# Patient Record
Sex: Male | Born: 1955 | Race: Black or African American | Hispanic: No | Smoking: Never smoker
Health system: Southern US, Community
[De-identification: ages and names within clinical notes are randomized; demographics above are authoritative.]

## PROBLEM LIST (undated history)

## (undated) DIAGNOSIS — E559 Vitamin D deficiency, unspecified: Secondary | ICD-10-CM

## (undated) DIAGNOSIS — H2510 Age-related nuclear cataract, unspecified eye: Secondary | ICD-10-CM

## (undated) DIAGNOSIS — E119 Type 2 diabetes mellitus without complications: Secondary | ICD-10-CM

## (undated) DIAGNOSIS — D509 Iron deficiency anemia, unspecified: Secondary | ICD-10-CM

## (undated) DIAGNOSIS — M2041 Other hammer toe(s) (acquired), right foot: Secondary | ICD-10-CM

## (undated) DIAGNOSIS — I10 Essential (primary) hypertension: Secondary | ICD-10-CM

## (undated) DIAGNOSIS — D631 Anemia in chronic kidney disease: Secondary | ICD-10-CM

## (undated) DIAGNOSIS — N189 Chronic kidney disease, unspecified: Secondary | ICD-10-CM

## (undated) DIAGNOSIS — T148XXA Other injury of unspecified body region, initial encounter: Secondary | ICD-10-CM

## (undated) DIAGNOSIS — I214 Non-ST elevation (NSTEMI) myocardial infarction: Secondary | ICD-10-CM

## (undated) DIAGNOSIS — I428 Other cardiomyopathies: Secondary | ICD-10-CM

## (undated) DIAGNOSIS — H3581 Retinal edema: Secondary | ICD-10-CM

## (undated) DIAGNOSIS — E782 Mixed hyperlipidemia: Secondary | ICD-10-CM

## (undated) DIAGNOSIS — Z800 Family history of malignant neoplasm of digestive organs: Secondary | ICD-10-CM

## (undated) DIAGNOSIS — N2581 Secondary hyperparathyroidism of renal origin: Secondary | ICD-10-CM

## (undated) DIAGNOSIS — I82409 Acute embolism and thrombosis of unspecified deep veins of unspecified lower extremity: Secondary | ICD-10-CM

## (undated) DIAGNOSIS — I509 Heart failure, unspecified: Secondary | ICD-10-CM

## (undated) DIAGNOSIS — H409 Unspecified glaucoma: Secondary | ICD-10-CM

## (undated) DIAGNOSIS — I739 Peripheral vascular disease, unspecified: Secondary | ICD-10-CM

## (undated) DIAGNOSIS — N186 End stage renal disease: Secondary | ICD-10-CM

## (undated) DIAGNOSIS — C189 Malignant neoplasm of colon, unspecified: Secondary | ICD-10-CM

## (undated) HISTORY — DX: Other cardiomyopathies: I42.8

## (undated) HISTORY — DX: Unspecified glaucoma: H40.9

## (undated) HISTORY — DX: Other hammer toe(s) (acquired), right foot: M20.41

## (undated) HISTORY — DX: Peripheral vascular disease, unspecified: I73.9

## (undated) HISTORY — DX: Mixed hyperlipidemia: E78.2

## (undated) HISTORY — DX: Heart failure, unspecified: I50.9

## (undated) HISTORY — DX: Chronic kidney disease, unspecified: N18.9

## (undated) HISTORY — DX: Anemia in chronic kidney disease: D63.1

## (undated) HISTORY — DX: Secondary hyperparathyroidism of renal origin: N25.81

## (undated) HISTORY — PX: OTHER SURGICAL HISTORY: SHX169

## (undated) HISTORY — DX: Acute embolism and thrombosis of unspecified deep veins of unspecified lower extremity: I82.409

## (undated) HISTORY — DX: Vitamin D deficiency, unspecified: E55.9

## (undated) HISTORY — DX: Family history of malignant neoplasm of digestive organs: Z80.0

## (undated) HISTORY — DX: Iron deficiency anemia, unspecified: D50.9

## (undated) HISTORY — DX: Age-related nuclear cataract, unspecified eye: H25.10

## (undated) HISTORY — DX: Non-ST elevation (NSTEMI) myocardial infarction: I21.4

## (undated) HISTORY — DX: End stage renal disease: N18.6

## (undated) HISTORY — PX: COLONOSCOPY: SHX174

## (undated) HISTORY — DX: Malignant neoplasm of colon, unspecified: C18.9

## (undated) HISTORY — DX: Retinal edema: H35.81

---

## 2009-07-11 ENCOUNTER — Emergency Department: Payer: Self-pay | Admitting: Internal Medicine

## 2010-06-22 DIAGNOSIS — H3581 Retinal edema: Secondary | ICD-10-CM | POA: Insufficient documentation

## 2010-06-22 DIAGNOSIS — H2510 Age-related nuclear cataract, unspecified eye: Secondary | ICD-10-CM | POA: Insufficient documentation

## 2010-10-01 DIAGNOSIS — I10 Essential (primary) hypertension: Secondary | ICD-10-CM | POA: Insufficient documentation

## 2010-12-20 ENCOUNTER — Emergency Department: Payer: Self-pay | Admitting: Emergency Medicine

## 2011-05-08 ENCOUNTER — Emergency Department: Payer: Self-pay | Admitting: *Deleted

## 2012-11-03 ENCOUNTER — Emergency Department: Payer: Self-pay | Admitting: Internal Medicine

## 2014-02-20 DIAGNOSIS — E782 Mixed hyperlipidemia: Secondary | ICD-10-CM | POA: Insufficient documentation

## 2014-02-20 DIAGNOSIS — E1139 Type 2 diabetes mellitus with other diabetic ophthalmic complication: Secondary | ICD-10-CM | POA: Insufficient documentation

## 2014-02-20 DIAGNOSIS — R0601 Orthopnea: Secondary | ICD-10-CM | POA: Insufficient documentation

## 2014-02-20 DIAGNOSIS — E1140 Type 2 diabetes mellitus with diabetic neuropathy, unspecified: Secondary | ICD-10-CM | POA: Insufficient documentation

## 2014-09-06 ENCOUNTER — Encounter: Payer: Self-pay | Admitting: Emergency Medicine

## 2014-09-06 ENCOUNTER — Emergency Department
Admission: EM | Admit: 2014-09-06 | Discharge: 2014-09-07 | Disposition: A | Discharge status: Other Acute Inpatient Hospital | Payer: Medicaid Other | Attending: Emergency Medicine | Admitting: Emergency Medicine

## 2014-09-06 DIAGNOSIS — I10 Essential (primary) hypertension: Secondary | ICD-10-CM | POA: Insufficient documentation

## 2014-09-06 DIAGNOSIS — F32A Depression, unspecified: Secondary | ICD-10-CM

## 2014-09-06 DIAGNOSIS — F1410 Cocaine abuse, uncomplicated: Secondary | ICD-10-CM | POA: Diagnosis not present

## 2014-09-06 DIAGNOSIS — F1910 Other psychoactive substance abuse, uncomplicated: Secondary | ICD-10-CM

## 2014-09-06 DIAGNOSIS — F329 Major depressive disorder, single episode, unspecified: Secondary | ICD-10-CM | POA: Diagnosis not present

## 2014-09-06 DIAGNOSIS — E119 Type 2 diabetes mellitus without complications: Secondary | ICD-10-CM | POA: Insufficient documentation

## 2014-09-06 HISTORY — DX: Type 2 diabetes mellitus without complications: E11.9

## 2014-09-06 HISTORY — DX: Other injury of unspecified body region, initial encounter: T14.8XXA

## 2014-09-06 HISTORY — DX: Essential (primary) hypertension: I10

## 2014-09-06 LAB — COMPREHENSIVE METABOLIC PANEL
ALT: 11 U/L — ABNORMAL LOW (ref 17–63)
AST: 24 U/L (ref 15–41)
Albumin: 3.1 g/dL — ABNORMAL LOW (ref 3.5–5.0)
Alkaline Phosphatase: 58 U/L (ref 38–126)
Anion gap: 7 (ref 5–15)
BILIRUBIN TOTAL: 0.5 mg/dL (ref 0.3–1.2)
BUN: 14 mg/dL (ref 6–20)
CO2: 29 mmol/L (ref 22–32)
Calcium: 8.4 mg/dL — ABNORMAL LOW (ref 8.9–10.3)
Chloride: 100 mmol/L — ABNORMAL LOW (ref 101–111)
Creatinine, Ser: 0.98 mg/dL (ref 0.61–1.24)
GLUCOSE: 306 mg/dL — AB (ref 65–99)
Potassium: 3.8 mmol/L (ref 3.5–5.1)
Sodium: 136 mmol/L (ref 135–145)
TOTAL PROTEIN: 6.4 g/dL — AB (ref 6.5–8.1)

## 2014-09-06 LAB — CBC WITH DIFFERENTIAL/PLATELET
Basophils Absolute: 0.1 10*3/uL (ref 0–0.1)
Basophils Relative: 1 %
EOS ABS: 0.1 10*3/uL (ref 0–0.7)
Eosinophils Relative: 2 %
HEMATOCRIT: 34.5 % — AB (ref 40.0–52.0)
Hemoglobin: 11.1 g/dL — ABNORMAL LOW (ref 13.0–18.0)
LYMPHS ABS: 2.3 10*3/uL (ref 1.0–3.6)
Lymphocytes Relative: 35 %
MCH: 28.8 pg (ref 26.0–34.0)
MCHC: 32.1 g/dL (ref 32.0–36.0)
MCV: 89.7 fL (ref 80.0–100.0)
MONO ABS: 0.4 10*3/uL (ref 0.2–1.0)
Monocytes Relative: 6 %
NEUTROS ABS: 3.6 10*3/uL (ref 1.4–6.5)
Neutrophils Relative %: 56 %
Platelets: 280 10*3/uL (ref 150–440)
RBC: 3.84 MIL/uL — AB (ref 4.40–5.90)
RDW: 12.7 % (ref 11.5–14.5)
WBC: 6.5 10*3/uL (ref 3.8–10.6)

## 2014-09-06 LAB — URINE DRUG SCREEN, QUALITATIVE (ARMC ONLY)
AMPHETAMINES, UR SCREEN: NOT DETECTED
Barbiturates, Ur Screen: NOT DETECTED
Benzodiazepine, Ur Scrn: NOT DETECTED
Cannabinoid 50 Ng, Ur ~~LOC~~: NOT DETECTED
Cocaine Metabolite,Ur ~~LOC~~: POSITIVE — AB
MDMA (ECSTASY) UR SCREEN: NOT DETECTED
Methadone Scn, Ur: NOT DETECTED
Opiate, Ur Screen: NOT DETECTED
PHENCYCLIDINE (PCP) UR S: NOT DETECTED
Tricyclic, Ur Screen: NOT DETECTED

## 2014-09-06 NOTE — ED Notes (Signed)
BEHAVIORAL HEALTH ROUNDING Patient sleeping: No. Patient alert and oriented: yes Behavior appropriate: Yes.   Nutrition and fluids offered: Yes  Toileting and hygiene offered: Yes  Sitter present: q15 min observations and security camera monitoring Law enforcement present: Yes [employer]

## 2014-09-06 NOTE — ED Notes (Signed)
BEHAVIORAL HEALTH ROUNDING Patient sleeping: Yes.   Patient alert and oriented: not applicable Behavior appropriate: Yes.    Nutrition and fluids offered: No Toileting and hygiene offered: No Sitter present: q15 minute observations and security camera monitoring Law enforcement present: Yes [employer] 

## 2014-09-06 NOTE — ED Notes (Signed)

## 2014-09-06 NOTE — ED Notes (Signed)
Patient observed lying in bed with eyes closed  Even, unlabored respirations observed   NAD pt appears to be sleeping  I will continue to monitor along with every 15 minute visual observations and ongoing security camera monitoring    

## 2014-09-06 NOTE — ED Notes (Signed)
ENVIRONMENTAL ASSESSMENT Potentially harmful objects out of patient reach: Yes.   Personal belongings secured: Yes.   Patient dressed in hospital provided attire only: Yes.   Plastic bags out of patient reach: Yes.   Patient care equipment removed: Yes.   Equipment and supplies removed: Yes.   Potentially toxic materials out of patient reach: Yes.   Sharps container removed or out of patient reach: Yes.   ED BHU [place] Is the patient under IVC or is there intent for IVC: No. Is the patient medically cleared: Yes.   Is there vacancy in the ED BHU: Yes.   Is the population mix appropriate for patient: Yes. Is the patient awaiting placement in inpatient or outpatient setting: unknown at this time. Has the patient had a psychiatric consult: No.   Survey of unit performed for contraband, proper placement and condition of furniture, tampering with fixtures in bathroom, shower, and each patient room: Yes.  ; Findings: none APPEARANCE/BEHAVIOR Calm, cooperative NEURO ASSESSMENT Orientation: A&O x4 Hallucinations: None noted at this time Speech: Normal Gait: normal RESPIRATORY ASSESSMENT No respiratory distress noted CARDIOVASCULAR ASSESSMENT Skin color appropriate for age and race GASTROINTESTINAL ASSESSMENT no GI distress noted EXTREMITIES Moves all extremities PLAN OF CARE Provide calm/safe environment. Vital signs assessed twice daily. ED BHU Assessment once each 12-hour shift. Collaborate with intake RN daily or as condition indicates. Assure the ED provider has rounded once each shift. Provide and encourage hygiene. Provide redirection as needed. Assess for escalating behavior; address immediately and inform ED provider.  Assess family dynamic and appropriateness for visitation as needed: Yes.   Educate the patient/family about BHU procedures/visitation: Yes.

## 2014-09-06 NOTE — ED Notes (Signed)
BEHAVIORAL HEALTH ROUNDING Patient sleeping: No. Patient alert and oriented: yes Behavior appropriate: Yes.  ; If no, describe:  Nutrition and fluids offered: Yes  Toileting and hygiene offered: Yes  Sitter present: yes Law enforcement present: Yes  

## 2014-09-06 NOTE — ED Notes (Signed)
Pt is asleep in bed at this time and in no distress. Snack provided earlier in the evening. Will continue to monitor.

## 2014-09-06 NOTE — ED Notes (Signed)
Pt is asleep in bed and is no distress. Pt denies SI/HI and AVH. V/S WNL. Will continue to monitor.

## 2014-09-06 NOTE — ED Notes (Signed)
Pt comes into the ED searching for detox treatment.  Patient has a history of cocaine use for the past 3 years.  Patient states "I want help, I saw my best friend die from it".

## 2014-09-06 NOTE — ED Notes (Signed)
Resumed care from [employer].  Pt in room watching tv.  Pt calm and cooperative.  Pt denies SI or HI at this time.  Pt denies hearing voices at this time.  Pt alert.

## 2014-09-06 NOTE — ED Notes (Signed)
Pt eating dinner tray now.  Watching tv.

## 2014-09-06 NOTE — ED Notes (Signed)
Pt just ate lunch tray.  Pt is calm and cooperative.  Watching tv.

## 2014-09-06 NOTE — ED Notes (Signed)
ED BHU [place] Is the patient under IVC or is there intent for IVC: no Is the patient medically cleared: Yes.   Is there vacancy in the ED BHU: Yes.   Is the population mix appropriate for patient: Yes.   Is the patient awaiting placement in inpatient or outpatient setting: No. Has the patient had a psychiatric consult:   No consult ordered Survey of unit performed for contraband, proper placement and condition of furniture, tampering with fixtures in bathroom, shower, and each patient room: Yes.  ; Findings:  APPEARANCE/BEHAVIOR Calm and cooperative NEURO ASSESSMENT Orientation: oriented x3  Denies pain Hallucinations: No.None noted (Hallucinations) Speech: Normal Gait: normal RESPIRATORY ASSESSMENT even unlabored respirations  CARDIOVASCULAR ASSESSMENT Pulses equal  regular rate  Skin warm and dry GASTROINTESTINAL ASSESSMENT no GI complaint EXTREMITIES Full ROM PLAN OF CARE Provide calm/safe environment. Vital signs assessed twice daily. ED BHU Assessment once each 12-hour shift. Collaborate with intake RN daily or as condition indicates. Assure the ED provider has rounded once each shift. Provide and encourage hygiene. Provide redirection as needed. Assess for escalating behavior; address immediately and inform ED provider.  Assess family dynamic and appropriateness for visitation as needed: Yes.  ; If necessary, describe findings:  Educate the patient/family about BHU procedures/visitation: Yes.  ; If necessary, describe findings:

## 2014-09-06 NOTE — BH Assessment (Signed)
Assessment Note  Andrew Chan is an 59 y.o. male. Andrew Chan reports that he is trying to get clean. He states "Its messing up my heart.  I saw my best friend die in front of me from drugs and it scared me".  He denied symptoms of depression or anxiety.  He denied auditory or visual hallucinations.  He denied suicidal or homicidal ideation or intent.  He reports drinking 3 cans of beer daily. He denied the use of drugs to the TTS, but informed other staff members of his use of cocaine.  UDS reported positive results of cocaine.   Axis I: Alcohol Abuse and Substance Abuse Axis II: Deferred Axis III:  Past Medical History  Diagnosis Date  . Hypertension   . Diabetes mellitus without complication   . Nerve damage    Axis IV: other psychosocial or environmental problems Axis V: 51-60 moderate symptoms  Past Medical History:  Past Medical History  Diagnosis Date  . Hypertension   . Diabetes mellitus without complication   . Nerve damage     History reviewed. No pertinent past surgical history.  Family History: No family history on file.  Social History:  reports that he has never smoked. He does not have any smokeless tobacco history on file. He reports that he drinks alcohol. He reports that he uses illicit drugs (Cocaine).  Additional Social History:  Alcohol / Drug Use History of alcohol / drug use?: Yes Negative Consequences of Use:  (None reported) Substance #1 Name of Substance 1: Alcohol 1 - Age of First Use: 21 1 - Amount (size/oz): 36 oz 1 - Frequency: daily 1 - Last Use / Amount: 09/04/2014 Substance #2 Name of Substance 2: Cocaine 2 - Age of First Use: Unknown 2 - Amount (size/oz): unknown 2 - Frequency: unknown 2 - Last Use / Amount: Unknown  CIWA: CIWA-Ar BP: (!) 147/92 mmHg Pulse Rate: 91 COWS: Clinical Opiate Withdrawal Scale (COWS) Resting Pulse Rate: Pulse Rate 101-120 Sweating: No report of chills or flushing Restlessness: Able to sit still Pupil Size:  Pupils possibly larger than normal for room light Bone or Joint Aches: Not present Runny Nose or Tearing: Nasal stuffiness or unusually moist eyes GI Upset: No GI symptoms Tremor: No tremor Yawning: No yawning Anxiety or Irritability: None Gooseflesh Skin: Skin is smooth COWS Total Score: 4  Allergies: No Known Allergies  Home Medications:  (Not in a hospital admission)  OB/GYN Status:  No LMP for male patient.  General Assessment Data Location of Assessment: [facility] TTS Assessment: In system Is this a Tele or Face-to-Face Assessment?: Face-to-Face Is this an Initial Assessment or a Re-assessment for this encounter?: Initial Assessment Marital status: Divorced [place] name: n/a Is patient pregnant?: No Pregnancy Status: No Living Arrangements: Other relatives Can pt return to current living arrangement?: Yes Admission Status: Voluntary Is patient capable of signing voluntary admission?: Yes Referral Source: MD Insurance type: Medicaid  Medical Screening Exam ([place]) Medical Exam completed: Yes  Crisis Care Plan Living Arrangements: Other relatives Name of Psychiatrist: None Name of Therapist: None  Education Status Is patient currently in school?: No Current Grade: n/a Highest grade of school patient has completed: 11th Name of school: [employer] Contact person: n/a  Risk to self with the past 6 months Suicidal Ideation: No Has patient been a risk to self within the past 6 months prior to admission? : No Suicidal Intent: No Has patient had any suicidal intent within the past 6 months prior  to admission? : No Is patient at risk for suicide?: No Suicidal Plan?: No Has patient had any suicidal plan within the past 6 months prior to admission? : No Access to Means: No What has been your use of drugs/alcohol within the last 12 months?: alcohol and cocaine usage Previous Attempts/Gestures: No How many times?: 0 Other Self Harm Risks: substance  abuse Triggers for Past Attempts: None known Intentional Self Injurious Behavior: None Family Suicide History: No Recent stressful life event(s): Financial Problems Persecutory voices/beliefs?: No Depression: No Depression Symptoms:  (none reported) Substance abuse history and/or treatment for substance abuse?: No Suicide prevention information given to non-admitted patients: Not applicable  Risk to Others within the past 6 months Homicidal Ideation: No Does patient have any lifetime risk of violence toward others beyond the six months prior to admission? : Unknown Thoughts of Harm to Others: No Current Homicidal Intent: No Current Homicidal Plan: No Access to Homicidal Means: No Identified Victim: None reported History of harm to others?: No Assessment of Violence: On admission Violent Behavior Description: none reported Does patient have access to weapons?: No Criminal Charges Pending?: No Does patient have a court date: No Is patient on probation?: No  Psychosis Hallucinations: None noted Delusions: None noted  Mental Status Report Appearance/Hygiene: In scrubs, Unremarkable Eye Contact: Fair Motor Activity: Unremarkable Speech: Unremarkable Level of Consciousness: Alert Mood: Euthymic Affect: Appropriate to circumstance Anxiety Level: None Thought Processes: Coherent Judgement: Unimpaired Orientation: Person, Place, Time, Situation Obsessive Compulsive Thoughts/Behaviors: None     ADLScreening [facility] Assessment Services) Patient's cognitive ability adequate to safely complete daily activities?: Yes Patient able to express need for assistance with ADLs?: Yes Independently performs ADLs?: Yes (appropriate for developmental age)  Prior Inpatient Therapy Prior Inpatient Therapy: No Prior Therapy Dates: n/a Prior Therapy Facilty/Provider(s): n/a Reason for Treatment: n/a  Prior Outpatient Therapy Prior Outpatient Therapy: Yes Prior Therapy Dates: n/a Prior  Therapy Facilty/Provider(s): n/a Reason for Treatment: n/a Does patient have an ACCT team?: No Does patient have Intensive In-House Services?  : No Does patient have [facility] services? : No Does patient have P4CC services?: No  ADL Screening (condition at time of admission) Patient's cognitive ability adequate to safely complete daily activities?: Yes Patient able to express need for assistance with ADLs?: Yes Independently performs ADLs?: Yes (appropriate for developmental age)       Abuse/Neglect Assessment (Assessment to be complete while patient is alone) Physical Abuse: Denies Verbal Abuse: Denies Sexual Abuse: Denies Exploitation of patient/patient's resources: Denies Self-Neglect: Denies Values / Beliefs Cultural Requests During Hospitalization: None Spiritual Requests During Hospitalization: None   Advance Directives (For Healthcare) Does patient have an advance directive?: No Would patient like information on creating an advanced directive?: No - patient declined information    Additional Information 1:1 In Past 12 Months?: No CIRT Risk: No Elopement Risk: No Does patient have medical clearance?: Yes     Disposition:  Disposition Initial Assessment Completed for this Encounter: Yes Disposition of Patient: Referred to ([facility]) Patient referred to: [facility]  On Site Evaluation by:   Reviewed with Physician:    Elmer Bales 09/06/2014 11:54 PM

## 2014-09-06 NOTE — ED Notes (Signed)
Pt transferred into ED BHU room 7   Patient assigned to appropriate care area. Patient oriented to unit/care area: Informed that, for their safety, care areas are designed for safety and monitored by security cameras at all times; Visiting hours and phone times explained to patient. Patient verbalizes understanding, and verbal contract for safety obtained.

## 2014-09-06 NOTE — ED Notes (Signed)
BEHAVIORAL HEALTH ROUNDING Patient sleeping: No. Patient alert and oriented: yes Behavior appropriate: Yes.  ; If no, describe: calm, cooperative Nutrition and fluids offered: Yes  Toileting and hygiene offered: Yes  Sitter present: q 15 min checks Law enforcement present: Yes

## 2014-09-06 NOTE — ED Provider Notes (Signed)
[facility] Emergency Department Provider Note  Time seen: 3:09 PM  I have reviewed the triage vital signs and the nursing notes.   HISTORY  Chief Complaint Drug Problem    HPI Andrew Chan is a 59 y.o. male with a past medical history of diabetes and hypertension presents to the emergency department looking for help in detoxing from crack cocaine. According to the patient he has been using crack cocaine for 3-4 years. Last use was a few days ago. Patient states he needs help to quit using crack cocaine. States a friend of his recently died from using crack cocaine. Patient admits depression, but specifically denies any SI or HI. No medical complaints today.     Past Medical History  Diagnosis Date  . Hypertension   . Diabetes mellitus without complication   . Nerve damage     There are no active problems to display for this patient.   History reviewed. No pertinent past surgical history.  No current outpatient prescriptions on file.  Allergies Review of patient's allergies indicates no known allergies.  No family history on file.  Social History History  Substance Use Topics  . Smoking status: Never Smoker   . Smokeless tobacco: Not on file  . Alcohol Use: Yes    Review of Systems Constitutional: Negative for fever Cardiovascular: Negative for chest pain. Respiratory: Negative for shortness of breath. Gastrointestinal: Negative for abdominal pain Neurological: Negative for headache 10-point ROS otherwise negative.  ____________________________________________   PHYSICAL EXAM:  VITAL SIGNS: ED Triage Vitals  Enc Vitals Group     BP 09/06/14 1322 155/100 mmHg     Pulse Rate 09/06/14 1322 101     Resp 09/06/14 1322 18     Temp 09/06/14 1322 97.8 F (36.6 C)     Temp Source 09/06/14 1322 Oral     SpO2 09/06/14 1322 98 %     Weight 09/06/14 1322 172 lb (78.019 kg)     Height 09/06/14 1322 5\' 8"  (1.727 m)     Head Cir --    Peak Flow --      Pain Score 09/06/14 1323 2     Pain Loc --      Pain Edu? --      Excl. in [place]? --     Constitutional: Alert and oriented. Well appearing and in no distress. Eyes: Normal exam ENT   Head: Normocephalic and atraumatic Cardiovascular: Normal rate, regular rhythm. No murmur Respiratory: Normal respiratory effort without tachypnea nor retractions. Breath sounds are clear and equal bilaterally. No wheezes/rales/rhonchi. Gastrointestinal: Soft and nontender. No distention.  Musculoskeletal: Nontender with normal range of motion in all extremities. Neurologic:  Normal speech and language. No gross focal neurologic deficits Skin:  Skin is warm, dry and intact.  Psychiatric: Mood and affect are normal. Speech and behavior are normal.   ____________________________________________   INITIAL IMPRESSION / ASSESSMENT AND PLAN / ED COURSE  Pertinent labs & imaging results that were available during my care of the patient were reviewed by me and considered in my medical decision making (see chart for details).  Patient here for crack cocaine detox. Last use was 2-3 days ago per patient. No medical complaints. We will check basic labs, and consult the behavioral health team to see what options he might have.  Blood work within normal limits, currently awaiting urine tox results. Awaiting behavioral health detox options. ____________________________________________   FINAL CLINICAL IMPRESSION(S) / ED DIAGNOSES  Crack cocaine abuse Substance abuse  Depression   Harvest Dark, MD 09/06/14 [phone]

## 2014-09-07 MED ORDER — METFORMIN HCL 1000 MG PO TABS: 1000.0000 mg | ORAL_TABLET | Freq: Two times a day (BID) | ORAL | Status: DC

## 2014-09-07 MED ORDER — GLIPIZIDE ER 5 MG PO TB24: 5.0000 mg | ORAL_TABLET | Freq: Every day | ORAL | Status: DC

## 2014-09-07 NOTE — ED Notes (Signed)
BEHAVIORAL HEALTH ROUNDING Patient sleeping: Yes.   Patient alert and oriented: not applicable Behavior appropriate: Yes.    Nutrition and fluids offered: No Toileting and hygiene offered: No Sitter present: q15 minute observations and security camera monitoring Law enforcement present: Yes [employer] 

## 2014-09-07 NOTE — Discharge Instructions (Signed)
Polysubstance Abuse °When people abuse more than one drug or type of drug it is called polysubstance or polydrug abuse. For example, many smokers also drink alcohol. This is one form of polydrug abuse. Polydrug abuse also refers to the use of a drug to counteract an unpleasant effect produced by another drug. It may also be used to help with withdrawal from another drug. People who take stimulants may become agitated. Sometimes this agitation is countered with a tranquilizer. This helps protect against the unpleasant side effects. Polydrug abuse also refers to the use of different drugs at the same time.  °Anytime drug use is interfering with normal living activities, it has become abuse. This includes problems with family and friends. Psychological dependence has developed when your mind tells you that the drug is needed. This is usually followed by physical dependence which has developed when continuing increases of drug are required to get the same feeling or "high". This is known as addiction or chemical dependency. A person's risk is much higher if there is a history of chemical dependency in the family. °SIGNS OF CHEMICAL DEPENDENCY °· You have been told by friends or family that drugs have become a problem. °· You fight when using drugs. °· You are having blackouts (not remembering what you do while using). °· You feel sick from using drugs but continue using. °· You lie about use or amounts of drugs (chemicals) used. °· You need chemicals to get you going. °· You are suffering in work performance or in school because of drug use. °· You get sick from use of drugs but continue to use anyway. °· You need drugs to relate to people or feel comfortable in social situations. °· You use drugs to forget problems. °"Yes" answered to any of the above signs of chemical dependency indicates there are problems. The longer the use of drugs continues, the greater the problems will become. °If there is a family history of  drug or alcohol use, it is best not to experiment with these drugs. Continual use leads to tolerance. After tolerance develops more of the drug is needed to get the same feeling. This is followed by addiction. With addiction, drugs become the most important part of life. It becomes more important to take drugs than participate in the other usual activities of life. This includes relating to friends and family. Addiction is followed by dependency. Dependency is a condition where drugs are now needed not just to get high, but to feel normal. °Addiction cannot be cured but it can be stopped. This often requires outside help and the care of professionals. Treatment centers are listed in the yellow pages under: Cocaine, Narcotics, and Alcoholics Anonymous. Most hospitals and clinics can refer you to a specialized care center. Talk to your caregiver if you need help. °Document Released: 09/12/2004 Document Revised: 04/15/2011 Document Reviewed: 01/21/2005 °Exit[employer]® Patient Information ©2015 [employer]. This information is not intended to replace advice given to you by your health care provider. Make sure you discuss any questions you have with your health care provider. ° °

## 2014-09-07 NOTE — ED Notes (Signed)
Pt is asleep in bed and in no distress. Will continue to monitor.

## 2014-09-07 NOTE — ED Provider Notes (Signed)
I assumed care of the patient 11:00 PM patient was accepted to [facility]. However facility requested that the patient be discharged with prescriptions for his diabetic management. Reviewed the patient's charts reveal and he was on glipizide and metformin which she will be discharged with there.  Gregor Hams, MD 09/07/14 [phone]

## 2014-09-07 NOTE — ED Notes (Signed)
Pt is awake in his room awaiting transportation to [facility]. He is in no distress.

## 2014-12-07 ENCOUNTER — Emergency Department: Payer: Medicaid Other

## 2014-12-07 ENCOUNTER — Inpatient Hospital Stay
Admission: EM | Admit: 2014-12-07 | Discharge: 2014-12-10 | DRG: 247 | Disposition: A | Discharge status: Home / Self Care | Payer: Medicaid Other | Attending: Internal Medicine | Admitting: Internal Medicine

## 2014-12-07 ENCOUNTER — Other Ambulatory Visit: Payer: Self-pay

## 2014-12-07 ENCOUNTER — Observation Stay
Admit: 2014-12-07 | Discharge: 2014-12-07 | Disposition: A | Discharge status: Home / Self Care | Payer: Medicaid Other | Attending: Physician Assistant | Admitting: Physician Assistant

## 2014-12-07 DIAGNOSIS — Y92238 Other place in hospital as the place of occurrence of the external cause: Secondary | ICD-10-CM | POA: Diagnosis not present

## 2014-12-07 DIAGNOSIS — Z79899 Other long term (current) drug therapy: Secondary | ICD-10-CM

## 2014-12-07 DIAGNOSIS — Z8249 Family history of ischemic heart disease and other diseases of the circulatory system: Secondary | ICD-10-CM

## 2014-12-07 DIAGNOSIS — F1490 Cocaine use, unspecified, uncomplicated: Secondary | ICD-10-CM | POA: Diagnosis present

## 2014-12-07 DIAGNOSIS — I2510 Atherosclerotic heart disease of native coronary artery without angina pectoris: Secondary | ICD-10-CM | POA: Diagnosis present

## 2014-12-07 DIAGNOSIS — Y900 Blood alcohol level of less than 20 mg/100 ml: Secondary | ICD-10-CM | POA: Diagnosis present

## 2014-12-07 DIAGNOSIS — R7989 Other specified abnormal findings of blood chemistry: Secondary | ICD-10-CM

## 2014-12-07 DIAGNOSIS — M79605 Pain in left leg: Secondary | ICD-10-CM | POA: Diagnosis present

## 2014-12-07 DIAGNOSIS — I10 Essential (primary) hypertension: Secondary | ICD-10-CM | POA: Diagnosis present

## 2014-12-07 DIAGNOSIS — R079 Chest pain, unspecified: Secondary | ICD-10-CM | POA: Diagnosis present

## 2014-12-07 DIAGNOSIS — E785 Hyperlipidemia, unspecified: Secondary | ICD-10-CM | POA: Diagnosis present

## 2014-12-07 DIAGNOSIS — F329 Major depressive disorder, single episode, unspecified: Secondary | ICD-10-CM | POA: Diagnosis present

## 2014-12-07 DIAGNOSIS — Z833 Family history of diabetes mellitus: Secondary | ICD-10-CM

## 2014-12-07 DIAGNOSIS — Y710 Diagnostic and monitoring cardiovascular devices associated with adverse incidents: Secondary | ICD-10-CM | POA: Diagnosis not present

## 2014-12-07 DIAGNOSIS — W19XXXA Unspecified fall, initial encounter: Secondary | ICD-10-CM

## 2014-12-07 DIAGNOSIS — Y848 Other medical procedures as the cause of abnormal reaction of the patient, or of later complication, without mention of misadventure at the time of the procedure: Secondary | ICD-10-CM | POA: Diagnosis not present

## 2014-12-07 DIAGNOSIS — E1142 Type 2 diabetes mellitus with diabetic polyneuropathy: Secondary | ICD-10-CM | POA: Diagnosis present

## 2014-12-07 DIAGNOSIS — I249 Acute ischemic heart disease, unspecified: Secondary | ICD-10-CM

## 2014-12-07 DIAGNOSIS — Z7984 Long term (current) use of oral hypoglycemic drugs: Secondary | ICD-10-CM

## 2014-12-07 DIAGNOSIS — I214 Non-ST elevation (NSTEMI) myocardial infarction: Principal | ICD-10-CM | POA: Diagnosis present

## 2014-12-07 DIAGNOSIS — F10129 Alcohol abuse with intoxication, unspecified: Secondary | ICD-10-CM | POA: Diagnosis present

## 2014-12-07 DIAGNOSIS — L7602 Intraoperative hemorrhage and hematoma of skin and subcutaneous tissue complicating other procedure: Secondary | ICD-10-CM | POA: Diagnosis not present

## 2014-12-07 DIAGNOSIS — R778 Other specified abnormalities of plasma proteins: Secondary | ICD-10-CM | POA: Diagnosis present

## 2014-12-07 LAB — ETHANOL: Alcohol, Ethyl (B): 173 mg/dL — ABNORMAL HIGH (ref <5)

## 2014-12-07 LAB — BASIC METABOLIC PANEL
Anion gap: 7 (ref 5–15)
BUN: 14 mg/dL (ref 6–20)
CALCIUM: 8.4 mg/dL — AB (ref 8.9–10.3)
CO2: 26 mmol/L (ref 22–32)
Chloride: 106 mmol/L (ref 101–111)
Creatinine, Ser: 0.97 mg/dL (ref 0.61–1.24)
GFR calc non Af Amer: >60 mL/min (ref >60)
Glucose, Bld: 214 mg/dL — ABNORMAL HIGH (ref 65–99)
Potassium: 3.4 mmol/L — ABNORMAL LOW (ref 3.5–5.1)
SODIUM: 139 mmol/L (ref 135–145)

## 2014-12-07 LAB — CBC
HCT: 33.5 % — ABNORMAL LOW (ref 40.0–52.0)
Hemoglobin: 10.9 g/dL — ABNORMAL LOW (ref 13.0–18.0)
MCH: 29.7 pg (ref 26.0–34.0)
MCHC: 32.5 g/dL (ref 32.0–36.0)
MCV: 91.2 fL (ref 80.0–100.0)
Platelets: 338 10*3/uL (ref 150–440)
RBC: 3.67 MIL/uL — AB (ref 4.40–5.90)
RDW: 13.2 % (ref 11.5–14.5)
WBC: 8.3 10*3/uL (ref 3.8–10.6)

## 2014-12-07 LAB — GLUCOSE, CAPILLARY
Glucose-Capillary: 133 mg/dL — ABNORMAL HIGH (ref 65–99)
Glucose-Capillary: 124 mg/dL — ABNORMAL HIGH (ref 65–99)
Glucose-Capillary: 96 mg/dL (ref 65–99)

## 2014-12-07 LAB — TSH: TSH: 2.034 u[IU]/mL (ref 0.350–4.500)

## 2014-12-07 LAB — HEMOGLOBIN A1C: Hgb A1c MFr Bld: 11.6 % — ABNORMAL HIGH (ref 4.0–6.0)

## 2014-12-07 LAB — TROPONIN I
TROPONIN I: 0.15 ng/mL — AB (ref <0.031)
Troponin I: 0.15 ng/mL — ABNORMAL HIGH (ref <0.031)
Troponin I: 0.15 ng/mL — ABNORMAL HIGH (ref <0.031)

## 2014-12-07 LAB — PROTIME-INR
INR: 0.93
PROTHROMBIN TIME: 12.7 s (ref 11.4–15.0)

## 2014-12-07 MED ORDER — ASPIRIN 81 MG PO CHEW
324.0000 mg | CHEWABLE_TABLET | Freq: Once | ORAL | Status: AC
  Administered 2014-12-07: 324 mg via ORAL
  Filled 2014-12-07: qty 4

## 2014-12-07 MED ORDER — LISINOPRIL 20 MG PO TABS
40.0000 mg | ORAL_TABLET | Freq: Every day | ORAL | Status: DC
Start: 2014-12-07 — End: 2014-12-08
  Administered 2014-12-07: 40 mg via ORAL
  Filled 2014-12-07: qty 2

## 2014-12-07 MED ORDER — HYDROCHLOROTHIAZIDE 25 MG PO TABS
25.0000 mg | ORAL_TABLET | Freq: Every day | ORAL | Status: DC
  Administered 2014-12-07: 25 mg via ORAL
  Filled 2014-12-07: qty 1

## 2014-12-07 MED ORDER — ONDANSETRON HCL 4 MG/2ML IJ SOLN: 4.0000 mg | Freq: Four times a day (QID) | INTRAMUSCULAR | Status: DC | PRN

## 2014-12-07 MED ORDER — INSULIN ASPART 100 UNIT/ML ~~LOC~~ SOLN
0.0000 [IU] | Freq: Three times a day (TID) | SUBCUTANEOUS | Status: DC
  Administered 2014-12-07 (×2): 1 [IU] via SUBCUTANEOUS
  Filled 2014-12-07 (×2): qty 1

## 2014-12-07 MED ORDER — NITROGLYCERIN 0.4 MG SL SUBL
0.4000 mg | SUBLINGUAL_TABLET | SUBLINGUAL | Status: DC | PRN
  Administered 2014-12-08: 0.4 mg via SUBLINGUAL

## 2014-12-07 MED ORDER — ONDANSETRON HCL 4 MG PO TABS: 4.0000 mg | ORAL_TABLET | Freq: Four times a day (QID) | ORAL | Status: DC | PRN

## 2014-12-07 MED ORDER — DOCUSATE SODIUM 100 MG PO CAPS
100.0000 mg | ORAL_CAPSULE | Freq: Two times a day (BID) | ORAL | Status: DC
  Administered 2014-12-07 – 2014-12-10 (×6): 100 mg via ORAL
  Filled 2014-12-07 (×7): qty 1

## 2014-12-07 MED ORDER — SODIUM CHLORIDE 0.9 % IJ SOLN
3.0000 mL | Freq: Two times a day (BID) | INTRAMUSCULAR | Status: DC
  Administered 2014-12-09: 3 mL via INTRAVENOUS

## 2014-12-07 MED ORDER — ACETAMINOPHEN 325 MG PO TABS: 650.0000 mg | ORAL_TABLET | Freq: Four times a day (QID) | ORAL | Status: DC | PRN

## 2014-12-07 MED ORDER — INFLUENZA VAC SPLIT QUAD 0.5 ML IM SUSY: 0.5000 mL | PREFILLED_SYRINGE | INTRAMUSCULAR | Status: DC

## 2014-12-07 MED ORDER — SODIUM CHLORIDE 0.9 % IJ SOLN
3.0000 mL | INTRAMUSCULAR | Status: DC | PRN
Start: 2014-12-07 — End: 2014-12-08

## 2014-12-07 MED ORDER — HEPARIN SODIUM (PORCINE) 5000 UNIT/ML IJ SOLN
5000.0000 [IU] | Freq: Three times a day (TID) | INTRAMUSCULAR | Status: DC
  Administered 2014-12-07 – 2014-12-08 (×4): 5000 [IU] via SUBCUTANEOUS
  Filled 2014-12-07 (×4): qty 1

## 2014-12-07 MED ORDER — SODIUM CHLORIDE 0.9 % WEIGHT BASED INFUSION
1.0000 mL/kg/h | INTRAVENOUS | Status: DC
  Administered 2014-12-08: 1 mL/kg/h via INTRAVENOUS

## 2014-12-07 MED ORDER — ACETAMINOPHEN 650 MG RE SUPP: 650.0000 mg | Freq: Four times a day (QID) | RECTAL | Status: DC | PRN

## 2014-12-07 MED ORDER — ATORVASTATIN CALCIUM 20 MG PO TABS
80.0000 mg | ORAL_TABLET | Freq: Every day | ORAL | Status: DC
  Administered 2014-12-07: 80 mg via ORAL
  Filled 2014-12-07: qty 4

## 2014-12-07 MED ORDER — POTASSIUM CHLORIDE IN NACL 20-0.9 MEQ/L-% IV SOLN
INTRAVENOUS | Status: DC
  Administered 2014-12-07 – 2014-12-08 (×3): via INTRAVENOUS
  Filled 2014-12-07 (×7): qty 1000

## 2014-12-07 MED ORDER — SODIUM CHLORIDE 0.9 % IJ SOLN: 3.0000 mL | Freq: Two times a day (BID) | INTRAMUSCULAR | Status: DC

## 2014-12-07 MED ORDER — SODIUM CHLORIDE 0.9 % WEIGHT BASED INFUSION: 3.0000 mL/kg/h | INTRAVENOUS | Status: AC

## 2014-12-07 MED ORDER — INSULIN ASPART 100 UNIT/ML ~~LOC~~ SOLN: 0.0000 [IU] | Freq: Every day | SUBCUTANEOUS | Status: DC

## 2014-12-07 MED ORDER — SODIUM CHLORIDE 0.9 % IV SOLN: 250.0000 mL | INTRAVENOUS | Status: DC | PRN

## 2014-12-07 MED ORDER — CITALOPRAM HYDROBROMIDE 20 MG PO TABS
20.0000 mg | ORAL_TABLET | Freq: Every day | ORAL | Status: DC
  Administered 2014-12-07: 20 mg via ORAL
  Filled 2014-12-07: qty 1

## 2014-12-07 MED ORDER — ASPIRIN EC 81 MG PO TBEC
81.0000 mg | DELAYED_RELEASE_TABLET | Freq: Every day | ORAL | Status: DC
  Administered 2014-12-07: 81 mg via ORAL
  Filled 2014-12-07: qty 1

## 2014-12-07 MED ORDER — ASPIRIN 81 MG PO CHEW
81.0000 mg | CHEWABLE_TABLET | ORAL | Status: AC
  Administered 2014-12-08: 81 mg via ORAL
  Filled 2014-12-07: qty 1

## 2014-12-07 NOTE — ED Notes (Signed)
Dr Diamond at bedside. 

## 2014-12-07 NOTE — ED Provider Notes (Signed)
[facility] Emergency Department Provider Note  ____________________________________________  Time seen: Approximately 4:09 AM  I have reviewed the triage vital signs and the nursing notes.   HISTORY  Chief Complaint Fall    HPI Andrew Chan is a 59 y.o. male who comes into the hospital today after having a fall. The patient reports that he was going to use the bathroom and he fell. The patient thinks his legs might have given out on him. The patient reports that his left foot is a little swollen but he is unsure why that is the case. The patient has a history of diabetes and bilateral lower extremity numbness. The patient reports he fell on his right side and did not hit his head. He reports that he was on the floor for one hour as he could not get up. 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 The patient reports that he has occasional chest pain with the last episode being yesterday he does have some shortness of breath he denies nausea and vomiting. He has occasional abdominal pain as well. The patient was not going to come into the hospital initially but decided to come in and get checked out.The patient again is not in any pain at this time.   Past Medical History  Diagnosis Date  . Hypertension   . Diabetes mellitus without complication ([place])   . Nerve damage     There are no active problems to display for this patient.   History reviewed. No pertinent past surgical history.  Current Outpatient Rx  Name  Route  Sig  Dispense  Refill  . atorvastatin (LIPITOR) 80 MG tablet   Oral   Take 80 mg by mouth daily.         . citalopram (CELEXA) 20 MG tablet   Oral   Take 20 mg by mouth daily.         Marland Kitchen glipiZIDE (GLUCOTROL XL) 5 MG 24 hr tablet   Oral   Take 1 tablet (5 mg total) by mouth daily.   30 tablet   0   . hydrochlorothiazide (HYDRODIURIL) 25 MG tablet   Oral   Take 25 mg by  mouth daily.         Marland Kitchen lisinopril (PRINIVIL,ZESTRIL) 40 MG tablet   Oral   Take 40 mg by mouth daily.         . metFORMIN (GLUCOPHAGE) 1000 MG tablet   Oral   Take 1 tablet (1,000 mg total) by mouth 2 (two) times daily with a meal.   60 tablet   0   . nitroGLYCERIN (NITROSTAT) 0.4 MG SL tablet   Sublingual   Place 0.4 mg under the tongue every 5 (five) minutes as needed for chest pain.           Allergies Review of patient's allergies indicates no known allergies.  No family history on file.  Social History Social History  Substance Use Topics  . Smoking status: Never Smoker   . Smokeless tobacco: None  . Alcohol Use: Yes    Review of Systems Constitutional: No fever/chills Eyes: No visual changes. ENT: No sore throat. Cardiovascular: chest pain. Respiratory: shortness of breath. Gastrointestinal: Intermittent abdominal pain.  No nausea, no vomiting.  No diarrhea.  No constipation. Genitourinary: Negative for dysuria. Musculoskeletal: Negative for back pain. Skin: Negative for rash. Neurological: Numbness in bilateral lower extremities. Negative for headaches, focal weakness  10-point ROS otherwise negative.  ____________________________________________   PHYSICAL EXAM:  VITAL SIGNS: ED Triage Vitals  Enc Vitals Group     BP 12/07/14 0355 134/91 mmHg     Pulse Rate 12/07/14 0355 93     Resp 12/07/14 0355 27     Temp 12/07/14 0355 98.6 F (37 C)     Temp src --      SpO2 12/07/14 0355 94 %     Weight 12/07/14 0355 165 lb (74.844 kg)     Height --      Head Cir --      Peak Flow --      Pain Score 12/07/14 0357 0     Pain Loc --      Pain Edu? --      Excl. in [place]? --     Constitutional: Alert and oriented. Well appearing and in no acute distress. Eyes: Conjunctivae are normal. PERRL. EOMI. Head: Atraumatic. Nose: No congestion/rhinnorhea. Mouth/Throat: Mucous membranes are moist.  Oropharynx non-erythematous. Cardiovascular: Normal rate,  regular rhythm. Grossly normal heart sounds.  Good peripheral circulation. Respiratory: Normal respiratory effort.  No retractions. Lungs CTAB. Gastrointestinal: Soft and nontender. No distention. Positive bowel sounds Musculoskeletal: Mild swelling to left lower extremity Neurologic:  Normal speech and language. No gross focal neurologic deficits are appreciated.  Skin:  Skin is warm, dry and intact.  Psychiatric: Mood and affect are normal.   ____________________________________________   LABS (all labs ordered are listed, but only abnormal results are displayed)  Labs Reviewed  BASIC METABOLIC PANEL - Abnormal; Notable for the following:    Potassium 3.4 (*)    Glucose, Bld 214 (*)    Calcium 8.4 (*)    All other components within normal limits  CBC - Abnormal; Notable for the following:    RBC 3.67 (*)    Hemoglobin 10.9 (*)    HCT 33.5 (*)    All other components within normal limits  ETHANOL - Abnormal; Notable for the following:    Alcohol, Ethyl (B) 173 (*)    All other components within normal limits  TROPONIN I - Abnormal; Notable for the following:    Troponin I 0.15 (*)    All other components within normal limits  URINALYSIS COMPLETEWITH MICROSCOPIC ([facility] ONLY)   ____________________________________________  EKG  ED ECG REPORT I, Loney Hering, the attending physician, personally viewed and interpreted this ECG.   Date: 12/07/2014  EKG Time: 358  Rate: 91  Rhythm: normal sinus rhythm  Axis: Normal  Intervals:none  ST&T Change: None  ____________________________________________  RADIOLOGY  Chest x-ray: No acute findings Lower extremity ultrasound ____________________________________________   PROCEDURES  Procedure(s) performed: None  Critical Care performed: No  ____________________________________________   INITIAL IMPRESSION / ASSESSMENT AND PLAN / ED COURSE  Pertinent labs & imaging results that were available during my care of  the patient were reviewed by me and considered in my medical decision making (see chart for details).  This is a 59 year old male who comes in today with a fall at home. The patient reports that his legs gave out on him. I will check some blood work as well as do a chest x-ray and an ultrasound of the patient's lower extremity and reassess the patient once I received the assaults.  The patient's blood work did show a troponin of 0.15. The patient did have some chest pain yesterday which could be the cause of the elevated troponin. The patient is also intoxicated with an  ethanol of 173. I contacted the hospitalist service to admit the patient for his acute coronary syndrome. At this time the patient is still waiting to receive his left lower extremity ultrasound ____________________________________________   FINAL CLINICAL IMPRESSION(S) / ED DIAGNOSES  Final diagnoses:  Fall, initial encounter  Acute coronary syndrome (HCC)      Loney Hering, MD 12/07/14 [phone]

## 2014-12-07 NOTE — Care Management (Signed)
Patient lives in residence with his sister Ada [phone]).  He says his legs get numb due to his diabetes which he says "I should do better with that."  Hb A1C results pending.  Patient says he is not on injections.  Is followed by the [place] [facility] by Dr Lucia Gaskins.  Does not know her first name.  Was seen at the clinic 2 weeks ago.  his sister provides transportation.  Patient denies problems accessing medical care or obtaining medications but admits to not taking his medication right.  He was positive for etoh and says his sister keeps saying he needs to stop that.  He does not feel he needs any resources.  There was no urine tox for this admission.  Patient denies any illicit drug use.  Confirmed address and correct his phone number from [phone] to [phone].

## 2014-12-07 NOTE — ED Notes (Addendum)
Patient transported to Ultrasound via stretcher 

## 2014-12-07 NOTE — ED Notes (Signed)
Pt attempting to provide urine specimen at this time.  

## 2014-12-07 NOTE — ED Notes (Signed)
Pt fell into the bathtub tonight while going to the bathroom. Per EMS pt does not know how long he was laying there. Family found pt. Per EMS pt has been drinking tonight. Pt states that he has not felt sensation in his lower legs for years, but has not seen a doctor about it. Pt has an old skin tear on his lower right leg.

## 2014-12-07 NOTE — ED Notes (Addendum)
Pt states that he does not feel anything below the knees, but this is not a new symptom. He has not been able to feel sensation below his knees for a few years now. Pt was able to bend his knees when taking off his pants to change into a hospital gown. Pt states that he normally ambulates at home despite sensation loss. Pt states "I think my legs just gave out on me."

## 2014-12-07 NOTE — Consult Note (Signed)
Primary Cardiologist: None   Reason for Consultation : Elevated troponin   HPI: This is a 59yoM with PMHx HTN, HL and DM who presented to [facility] via EMS after a fall. He denies CP, pressure, tightness, SOB, orthopnea or PND. He was found to have mildly elevated troponin, thus cardiology was consulted. Per patient, he does not have a history or CAD or MI.         Review of Systems: General: negative for chills, fever, night sweats or weight changes.  Cardiovascular: negative for chest pain, edema, orthopnea, palpitations, paroxysmal nocturnal dyspnea, shortness of breath or dyspnea on exertion Dermatological: negative for rash Respiratory: negative for cough or wheezing Urologic: negative for hematuria Abdominal: negative for nausea, vomiting, diarrhea, bright red blood per rectum, melena, or hematemesis Neurologic: negative for visual changes, syncope, or dizziness All other systems reviewed and are otherwise negative except as noted above.    Past Medical History  Diagnosis Date  . Hypertension   . Diabetes mellitus without complication ([place])   . Nerve damage     Medications Prior to Admission  Medication Sig Dispense Refill  . atorvastatin (LIPITOR) 80 MG tablet Take 80 mg by mouth daily.    . citalopram (CELEXA) 20 MG tablet Take 20 mg by mouth daily.    Marland Kitchen glipiZIDE (GLUCOTROL XL) 5 MG 24 hr tablet Take 1 tablet (5 mg total) by mouth daily. 30 tablet 0  . hydrochlorothiazide (HYDRODIURIL) 25 MG tablet Take 25 mg by mouth daily.    Marland Kitchen lisinopril (PRINIVIL,ZESTRIL) 40 MG tablet Take 40 mg by mouth daily.    . metFORMIN (GLUCOPHAGE) 1000 MG tablet Take 1 tablet (1,000 mg total) by mouth 2 (two) times daily with a meal. 60 tablet 0  . nitroGLYCERIN (NITROSTAT) 0.4 MG SL tablet Place 0.4 mg under the tongue every 5 (five) minutes as needed for chest pain.       Marland Kitchen aspirin EC  81 mg Oral Daily  . atorvastatin  80 mg Oral Daily  . citalopram  20 mg Oral Daily  . docusate  sodium  100 mg Oral BID  . heparin  5,000 Units Subcutaneous 3 times per day  . hydrochlorothiazide  25 mg Oral Daily  . insulin aspart  0-5 Units Subcutaneous QHS  . insulin aspart  0-9 Units Subcutaneous TID WC  . lisinopril  40 mg Oral Daily  . sodium chloride  3 mL Intravenous Q12H    Infusions: . 0.9 % NaCl with KCl 20 mEq / L 100 mL/hr at 12/07/14 0830    No Known Allergies  Social History   Social History  . Marital Status: Single    Spouse Name: N/A  . Number of Children: N/A  . Years of Education: N/A   Occupational History  . Not on file.   Social History Main Topics  . Smoking status: Never Smoker   . Smokeless tobacco: Not on file  . Alcohol Use: Yes  . Drug Use: Yes    Special: Cocaine  . Sexual Activity: Not on file   Other Topics Concern  . Not on file   Social History Narrative    Family History  Problem Relation Age of Onset  . CAD Brother   . CAD Mother   . Diabetes Mellitus II Sister     PHYSICAL EXAM: Filed Vitals:   12/07/14 0653  BP: 131/85  Pulse: 89  Temp: 98.6 F (37 C)  Resp: 18     Intake/Output Summary (  Last 24 hours) at 12/07/14 1112 Last data filed at 12/07/14 0655  Gross per 24 hour  Intake      0 ml  Output      0 ml  Net      0 ml    General:  Well appearing. No respiratory difficulty HEENT: normal Neck: supple. no JVD. Carotids 2+ bilat; no bruits. No lymphadenopathy or thryomegaly appreciated. Cor: PMI nondisplaced. Regular rate & rhythm. No rubs, gallops or murmurs. Lungs: clear Abdomen: soft, nontender, nondistended. No hepatosplenomegaly. No bruits or masses. Good bowel sounds. Extremities: no cyanosis, clubbing, rash, edema Neuro: alert & oriented x 3, cranial nerves grossly intact. moves all 4 extremities w/o difficulty. Affect pleasant.  ECG: NSR 91 BPM NS ST/T changes   Results for orders placed or performed during the hospital encounter of 12/07/14 (from the past 24 hour(s))  Basic metabolic panel      Status: Abnormal   Collection Time: 12/07/14  4:08 AM  Result Value Ref Range   Sodium 139 135 - 145 mmol/L   Potassium 3.4 (L) 3.5 - 5.1 mmol/L   Chloride 106 101 - 111 mmol/L   CO2 26 22 - 32 mmol/L   Glucose, Bld 214 (H) 65 - 99 mg/dL   BUN 14 6 - 20 mg/dL   Creatinine, Ser 0.97 0.61 - 1.24 mg/dL   Calcium 8.4 (L) 8.9 - 10.3 mg/dL   GFR calc non Af Amer >60 >60 mL/min   GFR calc Af Amer >60 >60 mL/min   Anion gap 7 5 - 15  CBC     Status: Abnormal   Collection Time: 12/07/14  4:08 AM  Result Value Ref Range   WBC 8.3 3.8 - 10.6 K/uL   RBC 3.67 (L) 4.40 - 5.90 MIL/uL   Hemoglobin 10.9 (L) 13.0 - 18.0 g/dL   HCT 33.5 (L) 40.0 - 52.0 %   MCV 91.2 80.0 - 100.0 fL   MCH 29.7 26.0 - 34.0 pg   MCHC 32.5 32.0 - 36.0 g/dL   RDW 13.2 11.5 - 14.5 %   Platelets 338 150 - 440 K/uL  Ethanol     Status: Abnormal   Collection Time: 12/07/14  4:08 AM  Result Value Ref Range   Alcohol, Ethyl (B) 173 (H) <5 mg/dL  Troponin I     Status: Abnormal   Collection Time: 12/07/14  4:08 AM  Result Value Ref Range   Troponin I 0.15 (H) <0.031 ng/mL  TSH     Status: None   Collection Time: 12/07/14  4:08 AM  Result Value Ref Range   TSH 2.034 0.350 - 4.500 uIU/mL   Dg Chest 2 View  12/07/2014  CLINICAL DATA:  Lower extremity swelling on the left. [place] and in the bathtub tonight. EXAM: CHEST  2 VIEW COMPARISON:  11/03/2012 FINDINGS: Unchanged moderate aortic tortuosity. Heart size is normal. The lungs are clear. There is no pleural effusion. Pulmonary vasculature is normal. No acute traumatic changes. IMPRESSION: No acute findings Electronically Signed   By: Andreas Newport M.D.   On: 12/07/2014 05:16   US Venous Img Lower Unilateral Left  12/07/2014  CLINICAL DATA:  Left lower extremity swelling for 2-3 weeks. EXAM: Left LOWER EXTREMITY VENOUS DOPPLER ULTRASOUND TECHNIQUE: Gray-scale sonography with graded compression, as well as color Doppler and duplex ultrasound were performed to evaluate  the lower extremity deep venous systems from the level of the common femoral vein and including the common femoral, femoral, profunda femoral, popliteal and  calf veins including the posterior tibial, peroneal and gastrocnemius veins when visible. The superficial great saphenous vein was also interrogated. Spectral Doppler was utilized to evaluate flow at rest and with distal augmentation maneuvers in the common femoral, femoral and popliteal veins. COMPARISON:  None. FINDINGS: Contralateral Common Femoral Vein: Respiratory phasicity is normal and symmetric with the symptomatic side. No evidence of thrombus. Normal compressibility. Common Femoral Vein: No evidence of thrombus. Normal compressibility, respiratory phasicity and response to augmentation. Saphenofemoral Junction: No evidence of thrombus. Normal compressibility and flow on color Doppler imaging. Profunda Femoral Vein: No evidence of thrombus. Normal compressibility and flow on color Doppler imaging. Femoral Vein: No evidence of thrombus. Normal compressibility, respiratory phasicity and response to augmentation. Popliteal Vein: No evidence of thrombus. Normal compressibility, respiratory phasicity and response to augmentation. Calf Veins: Poor visibility of the peroneal vein. Posterior tibial vein is patent with normal flow. Superficial Great Saphenous Vein: No evidence of thrombus. Normal compressibility and flow on color Doppler imaging. Venous Reflux:  None. Other Findings:  None. IMPRESSION: No evidence of deep venous thrombosis. Electronically Signed   By: Andreas Newport M.D.   On: 12/07/2014 06:32     ASSESSMENT: Mildly elevated 1st troponin   PLAN/DISCUSSION: No significant EKG changes, denies CP or SOB. Await next 2 troponin levels for necessity of cardiac catheterization. Advise checking echocardiogram today.    Patient and plan discussed with supervising provider, Dr. Neoma Laming, who agrees with above findings.   [facility] Fam  [place], [place] 12/07/2014 11:12 AM

## 2014-12-07 NOTE — ED Notes (Signed)
Patient transported to X-ray 

## 2014-12-07 NOTE — H&P (Signed)
Andrew Chan is an 59 y.o. male.   Chief Complaint: Weakness HPI: The patient presents emergency department via EMS after being on before and his home for an hour subsequent to a fall. Past medical history is significant for peripheral neuropathy secondary to diabetes. The patient admits to having numbness in his lower extremities for a long time now. Its common for him to trip at home. He did so this evening. He had no loss of consciousness nor did he hit his head. He simply was too weak to stand on his own. In the emergency department the patient complained of no pain however he was found to have an elevated troponin as well as elevated out all level. Due to his risk factors for heart disease, elevated troponin and unsteadiness the emergency department staff called for admission.  Past Medical History  Diagnosis Date  . Hypertension   . Diabetes mellitus without complication ([place])   . Nerve damage     Past Surgical History  Procedure Laterality Date  . None      Family History  Problem Relation Age of Onset  . CAD Brother   . CAD Mother   . Diabetes Mellitus II Sister    Social History:  reports that he has never smoked. He does not have any smokeless tobacco history on file. He reports that he drinks alcohol. He reports that he uses illicit drugs (Cocaine).  Allergies: No Known Allergies  Medications Prior to Admission  Medication Sig Dispense Refill  . atorvastatin (LIPITOR) 80 MG tablet Take 80 mg by mouth daily.    . citalopram (CELEXA) 20 MG tablet Take 20 mg by mouth daily.    Marland Kitchen glipiZIDE (GLUCOTROL XL) 5 MG 24 hr tablet Take 1 tablet (5 mg total) by mouth daily. 30 tablet 0  . hydrochlorothiazide (HYDRODIURIL) 25 MG tablet Take 25 mg by mouth daily.    Marland Kitchen lisinopril (PRINIVIL,ZESTRIL) 40 MG tablet Take 40 mg by mouth daily.    . metFORMIN (GLUCOPHAGE) 1000 MG tablet Take 1 tablet (1,000 mg total) by mouth 2 (two) times daily with a meal. 60 tablet 0  . nitroGLYCERIN  (NITROSTAT) 0.4 MG SL tablet Place 0.4 mg under the tongue every 5 (five) minutes as needed for chest pain.      Results for orders placed or performed during the hospital encounter of 12/07/14 (from the past 48 hour(s))  Basic metabolic panel     Status: Abnormal   Collection Time: 12/07/14  4:08 AM  Result Value Ref Range   Sodium 139 135 - 145 mmol/L   Potassium 3.4 (L) 3.5 - 5.1 mmol/L   Chloride 106 101 - 111 mmol/L   CO2 26 22 - 32 mmol/L   Glucose, Bld 214 (H) 65 - 99 mg/dL   BUN 14 6 - 20 mg/dL   Creatinine, Ser 0.97 0.61 - 1.24 mg/dL   Calcium 8.4 (L) 8.9 - 10.3 mg/dL   GFR calc non Af Amer >60 >60 mL/min   GFR calc Af Amer >60 >60 mL/min    Comment: (NOTE) The eGFR has been calculated using the CKD EPI equation. This calculation has not been validated in all clinical situations. eGFR's persistently <60 mL/min signify possible Chronic Kidney Disease.    Anion gap 7 5 - 15  CBC     Status: Abnormal   Collection Time: 12/07/14  4:08 AM  Result Value Ref Range   WBC 8.3 3.8 - 10.6 K/uL   RBC 3.67 (L) 4.40 -  5.90 MIL/uL   Hemoglobin 10.9 (L) 13.0 - 18.0 g/dL   HCT 33.5 (L) 40.0 - 52.0 %   MCV 91.2 80.0 - 100.0 fL   MCH 29.7 26.0 - 34.0 pg   MCHC 32.5 32.0 - 36.0 g/dL   RDW 13.2 11.5 - 14.5 %   Platelets 338 150 - 440 K/uL  Ethanol     Status: Abnormal   Collection Time: 12/07/14  4:08 AM  Result Value Ref Range   Alcohol, Ethyl (B) 173 (H) <5 mg/dL    Comment:        LOWEST DETECTABLE LIMIT FOR SERUM ALCOHOL IS 5 mg/dL FOR MEDICAL PURPOSES ONLY   Troponin I     Status: Abnormal   Collection Time: 12/07/14  4:08 AM  Result Value Ref Range   Troponin I 0.15 (H) <0.031 ng/mL    Comment: READ BACK AND VERIFIED BY JEANINA RODRIQUEZ _0  12/07/14.Marland KitchenAJO        PERSISTENTLY INCREASED TROPONIN VALUES IN THE RANGE OF 0.04-0.49 ng/mL CAN BE SEEN IN:       -UNSTABLE ANGINA       -CONGESTIVE HEART FAILURE       -MYOCARDITIS       -CHEST TRAUMA       -ARRYHTHMIAS        -LATE PRESENTING MYOCARDIAL INFARCTION       -COPD   CLINICAL FOLLOW-UP RECOMMENDED.    Dg Chest 2 View  12/07/2014  CLINICAL DATA:  Lower extremity swelling on the left. [place] and in the bathtub tonight. EXAM: CHEST  2 VIEW COMPARISON:  11/03/2012 FINDINGS: Unchanged moderate aortic tortuosity. Heart size is normal. The lungs are clear. There is no pleural effusion. Pulmonary vasculature is normal. No acute traumatic changes. IMPRESSION: No acute findings Electronically Signed   By: Andreas Newport M.D.   On: 12/07/2014 05:16   US Venous Img Lower Unilateral Left  12/07/2014  CLINICAL DATA:  Left lower extremity swelling for 2-3 weeks. EXAM: Left LOWER EXTREMITY VENOUS DOPPLER ULTRASOUND TECHNIQUE: Gray-scale sonography with graded compression, as well as color Doppler and duplex ultrasound were performed to evaluate the lower extremity deep venous systems from the level of the common femoral vein and including the common femoral, femoral, profunda femoral, popliteal and calf veins including the posterior tibial, peroneal and gastrocnemius veins when visible. The superficial great saphenous vein was also interrogated. Spectral Doppler was utilized to evaluate flow at rest and with distal augmentation maneuvers in the common femoral, femoral and popliteal veins. COMPARISON:  None. FINDINGS: Contralateral Common Femoral Vein: Respiratory phasicity is normal and symmetric with the symptomatic side. No evidence of thrombus. Normal compressibility. Common Femoral Vein: No evidence of thrombus. Normal compressibility, respiratory phasicity and response to augmentation. Saphenofemoral Junction: No evidence of thrombus. Normal compressibility and flow on color Doppler imaging. Profunda Femoral Vein: No evidence of thrombus. Normal compressibility and flow on color Doppler imaging. Femoral Vein: No evidence of thrombus. Normal compressibility, respiratory phasicity and response to augmentation. Popliteal Vein:  No evidence of thrombus. Normal compressibility, respiratory phasicity and response to augmentation. Calf Veins: Poor visibility of the peroneal vein. Posterior tibial vein is patent with normal flow. Superficial Great Saphenous Vein: No evidence of thrombus. Normal compressibility and flow on color Doppler imaging. Venous Reflux:  None. Other Findings:  None. IMPRESSION: No evidence of deep venous thrombosis. Electronically Signed   By: Andreas Newport M.D.   On: 12/07/2014 06:32    Review of Systems  Constitutional: Negative for fever and  chills.  HENT: Negative for sore throat and tinnitus.   Eyes: Negative for blurred vision and redness.  Respiratory: Negative for cough and shortness of breath.   Cardiovascular: Negative for chest pain, palpitations, orthopnea and PND.  Gastrointestinal: Negative for nausea, vomiting, abdominal pain and diarrhea.  Genitourinary: Negative for dysuria, urgency and frequency.  Musculoskeletal: Positive for falls. Negative for myalgias and joint pain.  Skin: Negative for rash.       No lesions  Neurological: Negative for speech change, focal weakness and weakness.  Endo/Heme/Allergies: Does not bruise/bleed easily.       No temperature intolerance  Psychiatric/Behavioral: Negative for depression and suicidal ideas.    Blood pressure 131/85, pulse 89, temperature 98.6 F (37 C), temperature source Oral, resp. rate 18, weight 74.844 kg (165 lb), SpO2 96 %. Physical Exam  Nursing note and vitals reviewed. Constitutional: He is oriented to person, place, and time. He appears well-developed and well-nourished. No distress.  HENT:  Head: Normocephalic and atraumatic.  Mouth/Throat: Oropharynx is clear and moist.  Eyes: Conjunctivae and EOM are normal. Pupils are equal, round, and reactive to light. No scleral icterus.  Neck: Normal range of motion. Neck supple. No JVD present. No tracheal deviation present. No thyromegaly present.  Cardiovascular: Normal  rate, regular rhythm and normal heart sounds.  Exam reveals no gallop and no friction rub.   No murmur heard. Respiratory: Effort normal and breath sounds normal. No respiratory distress. He has no wheezes.  GI: Soft. Bowel sounds are normal. He exhibits no distension. There is no tenderness.  Genitourinary:  Deferred  Musculoskeletal: Normal range of motion. He exhibits edema. He exhibits no tenderness.  Lymphadenopathy:    He has no cervical adenopathy.  Neurological: He is alert and oriented to person, place, and time. No cranial nerve deficit.  Skin: Skin is warm and dry. No rash noted. No erythema.  Psychiatric: He has a normal mood and affect. His behavior is normal. Judgment and thought content normal.     Assessment/Plan This is a 59 year old African American male admitted for elevated troponin level. 1. Elevated troponin: The patient has a chest pain at this time. EKG shows no evidence of ischemia. The patient has a history of coronary artery disease. He notes that he felt's severe chest pain yesterday and became short of breath while walking through his home. However, this is common for him. He seems to be unconcerned about his symptoms yesterday. His pain did not radiate nor was it associated with nausea or diaphoresis. We will continue to cycle patient's cardiac enzymes. I have ordered a cardiology consult for likely nuclear stress test today. 2. Coronary artery disease: Continue secondary prevention 3. Diabetes mellitus type 2: Complicated by neuropathy. I have held the patient's oral hypoglycemic agents and I have added sliding scale insulin while he is hospitalized. Add gabapentin. 4. Hyperlipidemia: Continue statin therapy 5. Depression: Continue citalopram 6. DVT prophylaxis: Heparin (Doppler ultrasound of lower extremities pending; low risk for DVT) 7. GI prophylaxis: None as the patient is not critically ill The patient is a full code. Time spent on admission orders and  patient care approximately 45 minutes  Harrie Foreman 12/07/2014, 6:55 AM

## 2014-12-07 NOTE — Progress Notes (Signed)
Notified MD. Pts CBG is 175 and pt is NPO. Orders to hold this dose of SSI. Will continue to assess.

## 2014-12-07 NOTE — Progress Notes (Signed)
Patient ID: Andrew Chan, male   DOB: 03-15-1955, 59 y.o.   MRN: [id number] [facility] PROGRESS NOTE  PCP: No primary care provider on file.  HPI/Subjective: Patient came in after a fall. He complains of left leg weakness. No complaints of chest pain. But found to have an elevated troponin.  Objective: Filed Vitals:   12/07/14 1145  BP: 149/93  Pulse: 89  Temp: 98.6 F (37 C)  Resp: 17    Filed Weights   12/07/14 0355 12/07/14 0653  Weight: 74.844 kg (165 lb) 80.468 kg (177 lb 6.4 oz)    ROS: Review of Systems  Constitutional: Negative for fever and chills.  Eyes: Negative for blurred vision.  Respiratory: Negative for cough and shortness of breath.   Cardiovascular: Negative for chest pain.  Gastrointestinal: Negative for nausea, vomiting, abdominal pain, diarrhea and constipation.  Genitourinary: Negative for dysuria.  Musculoskeletal: Negative for joint pain.  Neurological: Positive for focal weakness. Negative for dizziness and headaches.   Exam: Physical Exam  Constitutional: He is oriented to person, place, and time.  HENT:  Nose: No mucosal edema.  Mouth/Throat: No oropharyngeal exudate or posterior oropharyngeal edema.  Eyes: Conjunctivae, EOM and lids are normal. Pupils are equal, round, and reactive to light.  Neck: No JVD present. Carotid bruit is not present. No edema present. No thyroid mass and no thyromegaly present.  Cardiovascular: S1 normal and S2 normal.  Exam reveals no gallop.   No murmur heard. Pulses:      Dorsalis pedis pulses are 2+ on the right side, and 2+ on the left side.  Respiratory: No respiratory distress. He has no wheezes. He has no rhonchi. He has no rales.  GI: Soft. Bowel sounds are normal. There is no tenderness.  Musculoskeletal:       Right ankle: He exhibits no swelling.       Left ankle: He exhibits no swelling.  Lymphadenopathy:    He has no cervical adenopathy.  Neurological: He is alert and oriented to  person, place, and time. No cranial nerve deficit.  Power 5 out of 5 bilateral lower extremities. Patient unable to straight leg raise.  Skin: Skin is warm. No rash noted. Nails show no clubbing.  Psychiatric: He has a normal mood and affect.    Data Reviewed: Basic Metabolic Panel:  Recent Labs Lab 12/07/14 0408  NA 139  K 3.4*  CL 106  CO2 26  GLUCOSE 214*  BUN 14  CREATININE 0.97  CALCIUM 8.4*   CBC:  Recent Labs Lab 12/07/14 0408  WBC 8.3  HGB 10.9*  HCT 33.5*  MCV 91.2  PLT 338   Cardiac Enzymes:  Recent Labs Lab 12/07/14 0408 12/07/14 1058 12/07/14 1221  TROPONINI 0.15* 0.15* 0.15*    CBG:  Recent Labs Lab 12/07/14 1216  GLUCAP 133*   Studies: Dg Chest 2 View  12/07/2014  CLINICAL DATA:  Lower extremity swelling on the left. [place] and in the bathtub tonight. EXAM: CHEST  2 VIEW COMPARISON:  11/03/2012 FINDINGS: Unchanged moderate aortic tortuosity. Heart size is normal. The lungs are clear. There is no pleural effusion. Pulmonary vasculature is normal. No acute traumatic changes. IMPRESSION: No acute findings Electronically Signed   By: Andreas Newport M.D.   On: 12/07/2014 05:16   US Venous Img Lower Unilateral Left  12/07/2014  CLINICAL DATA:  Left lower extremity swelling for 2-3 weeks. EXAM: Left LOWER EXTREMITY VENOUS DOPPLER ULTRASOUND TECHNIQUE: Gray-scale sonography with graded compression, as well as color  Doppler and duplex ultrasound were performed to evaluate the lower extremity deep venous systems from the level of the common femoral vein and including the common femoral, femoral, profunda femoral, popliteal and calf veins including the posterior tibial, peroneal and gastrocnemius veins when visible. The superficial great saphenous vein was also interrogated. Spectral Doppler was utilized to evaluate flow at rest and with distal augmentation maneuvers in the common femoral, femoral and popliteal veins. COMPARISON:  None. FINDINGS:  Contralateral Common Femoral Vein: Respiratory phasicity is normal and symmetric with the symptomatic side. No evidence of thrombus. Normal compressibility. Common Femoral Vein: No evidence of thrombus. Normal compressibility, respiratory phasicity and response to augmentation. Saphenofemoral Junction: No evidence of thrombus. Normal compressibility and flow on color Doppler imaging. Profunda Femoral Vein: No evidence of thrombus. Normal compressibility and flow on color Doppler imaging. Femoral Vein: No evidence of thrombus. Normal compressibility, respiratory phasicity and response to augmentation. Popliteal Vein: No evidence of thrombus. Normal compressibility, respiratory phasicity and response to augmentation. Calf Veins: Poor visibility of the peroneal vein. Posterior tibial vein is patent with normal flow. Superficial Great Saphenous Vein: No evidence of thrombus. Normal compressibility and flow on color Doppler imaging. Venous Reflux:  None. Other Findings:  None. IMPRESSION: No evidence of deep venous thrombosis. Electronically Signed   By: Andreas Newport M.D.   On: 12/07/2014 06:32    Scheduled Meds: . [START ON 12/08/2014] aspirin  81 mg Oral Pre-Cath  . aspirin EC  81 mg Oral Daily  . atorvastatin  80 mg Oral Daily  . citalopram  20 mg Oral Daily  . docusate sodium  100 mg Oral BID  . heparin  5,000 Units Subcutaneous 3 times per day  . hydrochlorothiazide  25 mg Oral Daily  . insulin aspart  0-5 Units Subcutaneous QHS  . insulin aspart  0-9 Units Subcutaneous TID WC  . lisinopril  40 mg Oral Daily  . sodium chloride  3 mL Intravenous Q12H  . sodium chloride  3 mL Intravenous Q12H   Continuous Infusions: . 0.9 % NaCl with KCl 20 mEq / L 100 mL/hr at 12/07/14 0830  . [START ON 12/08/2014] sodium chloride     Followed by  . [START ON 12/08/2014] sodium chloride      Assessment/Plan:  1. Elevated troponin. 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 Cardiology saw and is  going to do a cardiac catheter tomorrow. Aspirin and statin ordered. No beta blocker with cocaine in the system. 2. Type 2 diabetes- oral medications on hold. Patient is on sliding scale. 3. Essential hypertension on lisinopril and hydrochlorothiazide. 4. Depression on Celexa 5. Left lower extremity weakness. Since cardiology is doing a cardiac catheter I cannot get physical therapy to see the patient yet. Alcohol and cocaine could be contributing.  Code Status:     Code Status Orders        Start     Ordered   12/07/14 0653  Full code   Continuous     12/07/14 0652     Disposition Plan: Home soon  Consultants:  Cardiology  Time spent: 38 minutes  Loletha Grayer  [facility]

## 2014-12-08 ENCOUNTER — Encounter
Admission: EM | Disposition: A | Discharge status: Home / Self Care | Payer: Self-pay | Source: Home / Self Care | Attending: Internal Medicine

## 2014-12-08 ENCOUNTER — Encounter: Payer: Self-pay | Admitting: Cardiovascular Disease

## 2014-12-08 DIAGNOSIS — Y92238 Other place in hospital as the place of occurrence of the external cause: Secondary | ICD-10-CM | POA: Diagnosis not present

## 2014-12-08 DIAGNOSIS — F1490 Cocaine use, unspecified, uncomplicated: Secondary | ICD-10-CM | POA: Diagnosis present

## 2014-12-08 DIAGNOSIS — I10 Essential (primary) hypertension: Secondary | ICD-10-CM | POA: Diagnosis present

## 2014-12-08 DIAGNOSIS — I2510 Atherosclerotic heart disease of native coronary artery without angina pectoris: Secondary | ICD-10-CM | POA: Diagnosis present

## 2014-12-08 DIAGNOSIS — I249 Acute ischemic heart disease, unspecified: Secondary | ICD-10-CM | POA: Diagnosis present

## 2014-12-08 DIAGNOSIS — R079 Chest pain, unspecified: Secondary | ICD-10-CM | POA: Diagnosis present

## 2014-12-08 DIAGNOSIS — I214 Non-ST elevation (NSTEMI) myocardial infarction: Secondary | ICD-10-CM | POA: Diagnosis present

## 2014-12-08 DIAGNOSIS — Z79899 Other long term (current) drug therapy: Secondary | ICD-10-CM | POA: Diagnosis not present

## 2014-12-08 DIAGNOSIS — Z833 Family history of diabetes mellitus: Secondary | ICD-10-CM | POA: Diagnosis not present

## 2014-12-08 DIAGNOSIS — F10129 Alcohol abuse with intoxication, unspecified: Secondary | ICD-10-CM | POA: Diagnosis present

## 2014-12-08 DIAGNOSIS — F329 Major depressive disorder, single episode, unspecified: Secondary | ICD-10-CM | POA: Diagnosis present

## 2014-12-08 DIAGNOSIS — L7602 Intraoperative hemorrhage and hematoma of skin and subcutaneous tissue complicating other procedure: Secondary | ICD-10-CM | POA: Diagnosis not present

## 2014-12-08 DIAGNOSIS — Z8249 Family history of ischemic heart disease and other diseases of the circulatory system: Secondary | ICD-10-CM | POA: Diagnosis not present

## 2014-12-08 DIAGNOSIS — Y710 Diagnostic and monitoring cardiovascular devices associated with adverse incidents: Secondary | ICD-10-CM | POA: Diagnosis not present

## 2014-12-08 DIAGNOSIS — Y848 Other medical procedures as the cause of abnormal reaction of the patient, or of later complication, without mention of misadventure at the time of the procedure: Secondary | ICD-10-CM | POA: Diagnosis not present

## 2014-12-08 DIAGNOSIS — E785 Hyperlipidemia, unspecified: Secondary | ICD-10-CM | POA: Diagnosis present

## 2014-12-08 DIAGNOSIS — E1142 Type 2 diabetes mellitus with diabetic polyneuropathy: Secondary | ICD-10-CM | POA: Diagnosis present

## 2014-12-08 DIAGNOSIS — Z7984 Long term (current) use of oral hypoglycemic drugs: Secondary | ICD-10-CM | POA: Diagnosis not present

## 2014-12-08 DIAGNOSIS — M79605 Pain in left leg: Secondary | ICD-10-CM | POA: Diagnosis present

## 2014-12-08 DIAGNOSIS — Y900 Blood alcohol level of less than 20 mg/100 ml: Secondary | ICD-10-CM | POA: Diagnosis present

## 2014-12-08 HISTORY — PX: CARDIAC CATHETERIZATION: SHX172

## 2014-12-08 LAB — URINALYSIS COMPLETE WITH MICROSCOPIC (ARMC ONLY)
Bilirubin Urine: NEGATIVE
Glucose, UA: 50 mg/dL — AB
Leukocytes, UA: NEGATIVE
Nitrite: NEGATIVE
Protein, ur: 100 mg/dL — AB
Specific Gravity, Urine: 1.012 (ref 1.005–1.030)
pH: 5 (ref 5.0–8.0)

## 2014-12-08 LAB — GLUCOSE, CAPILLARY
Glucose-Capillary: 86 mg/dL (ref 65–99)
Glucose-Capillary: 85 mg/dL (ref 65–99)
Glucose-Capillary: 81 mg/dL (ref 65–99)
Glucose-Capillary: 119 mg/dL — ABNORMAL HIGH (ref 65–99)

## 2014-12-08 SURGERY — LEFT HEART CATH AND CORONARY ANGIOGRAPHY
Anesthesia: Moderate Sedation | Laterality: Left

## 2014-12-08 MED ORDER — HEPARIN (PORCINE) IN NACL 2-0.9 UNIT/ML-% IJ SOLN
INTRAMUSCULAR | Status: AC
Start: 2014-12-08 — End: 2014-12-08
  Filled 2014-12-08: qty 1000

## 2014-12-08 MED ORDER — CITALOPRAM HYDROBROMIDE 20 MG PO TABS
20.0000 mg | ORAL_TABLET | Freq: Every day | ORAL | Status: DC
  Administered 2014-12-08 – 2014-12-10 (×3): 20 mg via ORAL
  Filled 2014-12-08 (×3): qty 1

## 2014-12-08 MED ORDER — ACETAMINOPHEN 650 MG RE SUPP
650.0000 mg | Freq: Four times a day (QID) | RECTAL | Status: DC | PRN
  Filled 2014-12-08: qty 1

## 2014-12-08 MED ORDER — MIDAZOLAM HCL 2 MG/2ML IJ SOLN
INTRAMUSCULAR | Status: AC
  Filled 2014-12-08: qty 2

## 2014-12-08 MED ORDER — ONDANSETRON HCL 4 MG/2ML IJ SOLN: 4.0000 mg | Freq: Four times a day (QID) | INTRAMUSCULAR | Status: DC | PRN

## 2014-12-08 MED ORDER — SODIUM CHLORIDE 0.9 % IJ SOLN
3.0000 mL | Freq: Two times a day (BID) | INTRAMUSCULAR | Status: DC
  Administered 2014-12-08: 3 mL via INTRAVENOUS

## 2014-12-08 MED ORDER — ACETAMINOPHEN 325 MG PO TABS: 650.0000 mg | ORAL_TABLET | ORAL | Status: DC | PRN

## 2014-12-08 MED ORDER — CLOPIDOGREL BISULFATE 75 MG PO TABS
600.0000 mg | ORAL_TABLET | ORAL | Status: AC
  Administered 2014-12-09: 600 mg via ORAL
  Filled 2014-12-08: qty 8

## 2014-12-08 MED ORDER — SODIUM CHLORIDE 0.9 % IJ SOLN: 3.0000 mL | INTRAMUSCULAR | Status: DC | PRN

## 2014-12-08 MED ORDER — INSULIN ASPART 100 UNIT/ML ~~LOC~~ SOLN
0.0000 [IU] | Freq: Three times a day (TID) | SUBCUTANEOUS | Status: DC
  Administered 2014-12-10: 1 [IU] via SUBCUTANEOUS
  Filled 2014-12-08: qty 1

## 2014-12-08 MED ORDER — INSULIN ASPART 100 UNIT/ML ~~LOC~~ SOLN
0.0000 [IU] | Freq: Every day | SUBCUTANEOUS | Status: DC
  Administered 2014-12-09: 2 [IU] via SUBCUTANEOUS
  Filled 2014-12-08: qty 2

## 2014-12-08 MED ORDER — FENTANYL CITRATE (PF) 100 MCG/2ML IJ SOLN
INTRAMUSCULAR | Status: AC
  Filled 2014-12-08: qty 2

## 2014-12-08 MED ORDER — HEPARIN SODIUM (PORCINE) 5000 UNIT/ML IJ SOLN
5000.0000 [IU] | Freq: Three times a day (TID) | INTRAMUSCULAR | Status: DC
  Administered 2014-12-08 – 2014-12-10 (×5): 5000 [IU] via SUBCUTANEOUS
  Filled 2014-12-08 (×4): qty 1

## 2014-12-08 MED ORDER — SODIUM CHLORIDE 0.9 % IV SOLN: 250.0000 mL | INTRAVENOUS | Status: DC | PRN

## 2014-12-08 MED ORDER — ATORVASTATIN CALCIUM 20 MG PO TABS
80.0000 mg | ORAL_TABLET | Freq: Every day | ORAL | Status: DC
  Administered 2014-12-08 – 2014-12-10 (×3): 80 mg via ORAL
  Filled 2014-12-08 (×3): qty 4

## 2014-12-08 MED ORDER — SODIUM CHLORIDE 0.9 % WEIGHT BASED INFUSION
3.0000 mL/kg/h | INTRAVENOUS | Status: AC
  Administered 2014-12-09: 3 mL/kg/h via INTRAVENOUS

## 2014-12-08 MED ORDER — SODIUM CHLORIDE 0.9 % IJ SOLN
3.0000 mL | INTRAMUSCULAR | Status: DC | PRN
Start: 2014-12-08 — End: 2014-12-09

## 2014-12-08 MED ORDER — HYDROCHLOROTHIAZIDE 25 MG PO TABS
25.0000 mg | ORAL_TABLET | Freq: Every day | ORAL | Status: DC
  Administered 2014-12-08 – 2014-12-10 (×3): 25 mg via ORAL
  Filled 2014-12-08 (×3): qty 1

## 2014-12-08 MED ORDER — MIDAZOLAM HCL 2 MG/2ML IJ SOLN
INTRAMUSCULAR | Status: DC | PRN
  Administered 2014-12-08 (×2): 1 mg via INTRAVENOUS

## 2014-12-08 MED ORDER — FENTANYL CITRATE (PF) 100 MCG/2ML IJ SOLN
INTRAMUSCULAR | Status: DC | PRN
  Administered 2014-12-08 (×2): 50 ug via INTRAVENOUS

## 2014-12-08 MED ORDER — SODIUM CHLORIDE 0.9 % WEIGHT BASED INFUSION: 1.0000 mL/kg/h | INTRAVENOUS | Status: AC

## 2014-12-08 MED ORDER — ASPIRIN EC 81 MG PO TBEC
81.0000 mg | DELAYED_RELEASE_TABLET | Freq: Every day | ORAL | Status: DC
  Administered 2014-12-09: 325 mg via ORAL
  Administered 2014-12-09 – 2014-12-10 (×2): 81 mg via ORAL
  Filled 2014-12-08 (×3): qty 1

## 2014-12-08 MED ORDER — SODIUM CHLORIDE 0.9 % WEIGHT BASED INFUSION
1.0000 mL/kg/h | INTRAVENOUS | Status: DC
  Administered 2014-12-09: 1 mL/kg/h via INTRAVENOUS

## 2014-12-08 MED ORDER — IOHEXOL 300 MG/ML  SOLN
INTRAMUSCULAR | Status: DC | PRN
  Administered 2014-12-08: 150 mL via INTRA_ARTERIAL

## 2014-12-08 MED ORDER — LISINOPRIL 20 MG PO TABS
40.0000 mg | ORAL_TABLET | Freq: Every day | ORAL | Status: DC
  Administered 2014-12-08 – 2014-12-09 (×2): 40 mg via ORAL
  Filled 2014-12-08 (×2): qty 2

## 2014-12-08 MED ORDER — NITROGLYCERIN 0.4 MG SL SUBL
SUBLINGUAL_TABLET | SUBLINGUAL | Status: AC
Start: 2014-12-08 — End: 2014-12-08
  Filled 2014-12-08: qty 1

## 2014-12-08 MED ORDER — ACETAMINOPHEN 325 MG PO TABS
650.0000 mg | ORAL_TABLET | Freq: Four times a day (QID) | ORAL | Status: DC | PRN
  Filled 2014-12-08: qty 2

## 2014-12-08 MED ORDER — ASPIRIN 81 MG PO CHEW
324.0000 mg | CHEWABLE_TABLET | ORAL | Status: AC
  Administered 2014-12-09: 324 mg via ORAL
  Filled 2014-12-08: qty 4

## 2014-12-08 MED ORDER — SODIUM CHLORIDE 0.9 % IJ SOLN
3.0000 mL | Freq: Two times a day (BID) | INTRAMUSCULAR | Status: DC
  Administered 2014-12-09: 3 mL via INTRAVENOUS

## 2014-12-08 SURGICAL SUPPLY — 12 items
CATH INFINITI 5 FR 3DRC (CATHETERS) ×2 IMPLANT
CATH INFINITI 5FR ANG PIGTAIL (CATHETERS) ×2 IMPLANT
CATH INFINITI 5FR JL4 (CATHETERS) ×2 IMPLANT
CATH INFINITI 5FR JL5 (CATHETERS) ×2 IMPLANT
CATH INFINITI JR4 5F (CATHETERS) ×2 IMPLANT
DEVICE CLOSURE MYNXGRIP 5F (Vascular Products) ×2 IMPLANT
DEVICE SAFEGUARD 24CM (GAUZE/BANDAGES/DRESSINGS) ×2 IMPLANT
KIT MANI 3VAL PERCEP (MISCELLANEOUS) ×2 IMPLANT
NEEDLE PERC 18GX7CM (NEEDLE) ×2 IMPLANT
PACK CARDIAC CATH (CUSTOM PROCEDURE TRAY) ×2 IMPLANT
SHEATH PINNACLE 5F 10CM (SHEATH) ×4 IMPLANT
WIRE EMERALD 3MM-J .035X150CM (WIRE) ×2 IMPLANT

## 2014-12-08 NOTE — Care Management (Signed)
Stenting was attempted during cardiac cath today but unsuccessful will attempt again 11/4.

## 2014-12-08 NOTE — Progress Notes (Signed)
SUBJECTIVE: No further chest pain   Filed Vitals:   12/07/14 1611 12/07/14 2003 12/08/14 0519 12/08/14 0829  BP:  140/88 136/77 160/100  Pulse:  92 85 96  Temp:  98.5 F (36.9 C) 98.3 F (36.8 C) 98.4 F (36.9 C)  TempSrc:  Oral Oral Oral  Resp:  16 18 20   Height: 5\' 7"  (1.702 m)     Weight:   178 lb 4.8 oz (80.876 kg)   SpO2:  97% 98% 97%    Intake/Output Summary (Last 24 hours) at 12/08/14 1044 Last data filed at 12/08/14 0718  Gross per 24 hour  Intake   2190 ml  Output   1250 ml  Net    940 ml    LABS: Basic Metabolic Panel:  Recent Labs  12/07/14 0408  NA 139  K 3.4*  CL 106  CO2 26  GLUCOSE 214*  BUN 14  CREATININE 0.97  CALCIUM 8.4*   Liver Function Tests: No results for input(s): AST, ALT, ALKPHOS, BILITOT, PROT, ALBUMIN in the last 72 hours. No results for input(s): LIPASE, AMYLASE in the last 72 hours. CBC:  Recent Labs  12/07/14 0408  WBC 8.3  HGB 10.9*  HCT 33.5*  MCV 91.2  PLT 338   Cardiac Enzymes:  Recent Labs  12/07/14 0408 12/07/14 1058 12/07/14 1221  TROPONINI 0.15* 0.15* 0.15*   BNP: Invalid input(s): POCBNP D-Dimer: No results for input(s): DDIMER in the last 72 hours. Hemoglobin A1C:  Recent Labs  12/07/14 0408  HGBA1C 11.6*   Fasting Lipid Panel: No results for input(s): CHOL, HDL, LDLCALC, TRIG, CHOLHDL, LDLDIRECT in the last 72 hours. Thyroid Function Tests:  Recent Labs  12/07/14 0408  TSH 2.034   Anemia Panel: No results for input(s): VITAMINB12, FOLATE, FERRITIN, TIBC, IRON, RETICCTPCT in the last 72 hours.   PHYSICAL EXAM General: Well developed, well nourished, in no acute distress HEENT:  Normocephalic and atramatic Neck:  No JVD.  Lungs: Clear bilaterally to auscultation and percussion. Heart: HRRR . Normal S1 and S2 without gallops or murmurs.  Abdomen: Bowel sounds are positive, abdomen soft and non-tender  Msk:  Back normal, normal gait. Normal strength and tone for age. Extremities: No  clubbing, cyanosis or edema.   Neuro: Alert and oriented X 3. Psych:  Good affect, responds appropriately  TELEMETRY: NSR  ASSESSMENT AND PLAN: High rrade lesion in mid RCA with moderate mid LAd disease and normal LVEF. Advise PCI of RCA, but deffer as has small hematoma and with anticoagulants may cause more bleeding.  Active Problems:   Elevated troponin    Neoma Laming A, MD, [facility] 12/08/2014 10:44 AM

## 2014-12-08 NOTE — Progress Notes (Signed)
Patient ID: Andrew Chan, male   DOB: 1955-10-19, 59 y.o.   MRN: [id number] [facility] PROGRESS NOTE  PCP: No primary care provider on file.  HPI/Subjective: Patient came in after a fall and left leg weakness. Alcohol level was high when he came in. Found to have a borderline troponin without chest pain. He also complains of left leg pain that's been chronic.  Objective: Filed Vitals:   12/08/14 1346  BP: 156/99  Pulse: 92  Temp: 98.1 F (36.7 C)  Resp: 20    Filed Weights   12/07/14 0355 12/07/14 0653 12/08/14 0519  Weight: 74.844 kg (165 lb) 80.468 kg (177 lb 6.4 oz) 80.876 kg (178 lb 4.8 oz)    ROS: Review of Systems  Constitutional: Positive for malaise/fatigue. Negative for fever and chills.  Eyes: Negative for blurred vision.  Respiratory: Negative for cough and shortness of breath.   Cardiovascular: Negative for chest pain.  Gastrointestinal: Negative for nausea, vomiting, abdominal pain, diarrhea and constipation.  Genitourinary: Negative for dysuria.  Musculoskeletal: Negative for joint pain.  Neurological: Positive for focal weakness. Negative for dizziness and headaches.   Exam: Physical Exam  Constitutional: He is oriented to person, place, and time.  HENT:  Nose: No mucosal edema.  Mouth/Throat: No oropharyngeal exudate or posterior oropharyngeal edema.  Eyes: Conjunctivae, EOM and lids are normal. Pupils are equal, round, and reactive to light.  Neck: No JVD present. Carotid bruit is not present. No edema present. No thyroid mass and no thyromegaly present.  Cardiovascular: S1 normal and S2 normal.  Exam reveals no gallop.   No murmur heard. Pulses:      Dorsalis pedis pulses are 2+ on the right side, and 2+ on the left side.  Respiratory: No respiratory distress. He has no wheezes. He has no rhonchi. He has no rales.  GI: Soft. Bowel sounds are normal. There is no tenderness.  Musculoskeletal:       Right shoulder: He exhibits no swelling.   Lymphadenopathy:    He has no cervical adenopathy.  Neurological: He is alert and oriented to person, place, and time. No cranial nerve deficit.  Power 5 out of 5 bilateral upper and lower extremities  Skin: Skin is warm. No rash noted. Nails show no clubbing.  Psychiatric: He has a normal mood and affect.    Data Reviewed: Basic Metabolic Panel:  Recent Labs Lab 12/07/14 0408  NA 139  K 3.4*  CL 106  CO2 26  GLUCOSE 214*  BUN 14  CREATININE 0.97  CALCIUM 8.4*   CBC:  Recent Labs Lab 12/07/14 0408  WBC 8.3  HGB 10.9*  HCT 33.5*  MCV 91.2  PLT 338   Cardiac Enzymes:  Recent Labs Lab 12/07/14 0408 12/07/14 1058 12/07/14 1221  TROPONINI 0.15* 0.15* 0.15*    CBG:  Recent Labs Lab 12/07/14 1216 12/07/14 1657 12/07/14 2004 12/08/14 0729 12/08/14 1347  GLUCAP 133* 124* 96 86 85    Studies: Dg Chest 2 View  12/07/2014  CLINICAL DATA:  Lower extremity swelling on the left. [place] and in the bathtub tonight. EXAM: CHEST  2 VIEW COMPARISON:  11/03/2012 FINDINGS: Unchanged moderate aortic tortuosity. Heart size is normal. The lungs are clear. There is no pleural effusion. Pulmonary vasculature is normal. No acute traumatic changes. IMPRESSION: No acute findings Electronically Signed   By: Andreas Newport M.D.   On: 12/07/2014 05:16   US Venous Img Lower Unilateral Left  12/07/2014  CLINICAL DATA:  Left lower extremity  swelling for 2-3 weeks. EXAM: Left LOWER EXTREMITY VENOUS DOPPLER ULTRASOUND TECHNIQUE: Gray-scale sonography with graded compression, as well as color Doppler and duplex ultrasound were performed to evaluate the lower extremity deep venous systems from the level of the common femoral vein and including the common femoral, femoral, profunda femoral, popliteal and calf veins including the posterior tibial, peroneal and gastrocnemius veins when visible. The superficial great saphenous vein was also interrogated. Spectral Doppler was utilized to evaluate  flow at rest and with distal augmentation maneuvers in the common femoral, femoral and popliteal veins. COMPARISON:  None. FINDINGS: Contralateral Common Femoral Vein: Respiratory phasicity is normal and symmetric with the symptomatic side. No evidence of thrombus. Normal compressibility. Common Femoral Vein: No evidence of thrombus. Normal compressibility, respiratory phasicity and response to augmentation. Saphenofemoral Junction: No evidence of thrombus. Normal compressibility and flow on color Doppler imaging. Profunda Femoral Vein: No evidence of thrombus. Normal compressibility and flow on color Doppler imaging. Femoral Vein: No evidence of thrombus. Normal compressibility, respiratory phasicity and response to augmentation. Popliteal Vein: No evidence of thrombus. Normal compressibility, respiratory phasicity and response to augmentation. Calf Veins: Poor visibility of the peroneal vein. Posterior tibial vein is patent with normal flow. Superficial Great Saphenous Vein: No evidence of thrombus. Normal compressibility and flow on color Doppler imaging. Venous Reflux:  None. Other Findings:  None. IMPRESSION: No evidence of deep venous thrombosis. Electronically Signed   By: Andreas Newport M.D.   On: 12/07/2014 06:32    Scheduled Meds: . aspirin EC  81 mg Oral Daily  . atorvastatin  80 mg Oral Daily  . citalopram  20 mg Oral Daily  . docusate sodium  100 mg Oral BID  . heparin  5,000 Units Subcutaneous 3 times per day  . hydrochlorothiazide  25 mg Oral Daily  . Influenza vac split quadrivalent PF  0.5 mL Intramuscular Tomorrow-1000  . insulin aspart  0-5 Units Subcutaneous QHS  . insulin aspart  0-9 Units Subcutaneous TID WC  . lisinopril  40 mg Oral Daily  . nitroGLYCERIN      . sodium chloride  3 mL Intravenous Q12H  . sodium chloride  3 mL Intravenous Q12H   Continuous Infusions: . 0.9 % NaCl with KCl 20 mEq / L 100 mL/hr at 12/08/14 0631    Assessment/Plan:  1. Elevated troponin.  Patient found to have coronary artery disease on cardiac catheter today. They were unable to place a stent today and will try tomorrow. If he has a stent tomorrow this will put disposition on Saturday. Aspirin and atorvastatin. Hesitant on beta blocker at this time with history of cocaine in the past. 2. Type 2 diabetes- oral medications on hold. Patient is on sliding scale insulin. 3. Essential hypertension- last blood pressure elevated. Continue hydrochlorothiazide and lisinopril 4. Hyperlipidemia unspecified continue atorvastatin 5. Depression continue Celexa 6. Left leg weakness and pain- could be neuropathy. Unable to get physical therapy to see him at this point since cardiology is still working on cardiac catheter.  Code Status:     Code Status Orders        Start     Ordered   12/08/14 1039  Full code   Continuous     12/08/14 1038     Disposition Plan: Home Saturday if stent tomorrow  Consultants:  Cardiology  Procedures:  Cardiac catheterization  Time spent: 20 minutes earlier  [place], [place] [facility]

## 2014-12-08 NOTE — Progress Notes (Signed)
Patient is alert and oriented. Reporting no pain. Bilateral goins WNL, soft, no oozing/no draining. Pulses WNL. Report called to Tammy. Will transfer shortly.

## 2014-12-08 NOTE — Progress Notes (Signed)
Patient remains alert and oriented, denies pain or discomfort, patient resting in bed at this time, pt educated about not to eat or drink anything for his cardiac cath in Am,  Pt verbalized understanding , consent obtain. Will continue to monitor.

## 2014-12-09 ENCOUNTER — Encounter
Admission: EM | Disposition: A | Discharge status: Home / Self Care | Payer: Self-pay | Source: Home / Self Care | Attending: Internal Medicine

## 2014-12-09 HISTORY — PX: CARDIAC CATHETERIZATION: SHX172

## 2014-12-09 LAB — GLUCOSE, CAPILLARY
Glucose-Capillary: 125 mg/dL — ABNORMAL HIGH (ref 65–99)
Glucose-Capillary: 89 mg/dL (ref 65–99)
Glucose-Capillary: 111 mg/dL — ABNORMAL HIGH (ref 65–99)
Glucose-Capillary: 207 mg/dL — ABNORMAL HIGH (ref 65–99)

## 2014-12-09 LAB — TROPONIN I: TROPONIN I: 0.05 ng/mL — AB (ref <0.031)

## 2014-12-09 SURGERY — CORONARY STENT INTERVENTION
Anesthesia: Moderate Sedation

## 2014-12-09 MED ORDER — MIDAZOLAM HCL 2 MG/2ML IJ SOLN
INTRAMUSCULAR | Status: AC
  Filled 2014-12-09: qty 2

## 2014-12-09 MED ORDER — SODIUM CHLORIDE 0.9 % IJ SOLN: 3.0000 mL | INTRAMUSCULAR | Status: DC | PRN

## 2014-12-09 MED ORDER — FENTANYL CITRATE (PF) 100 MCG/2ML IJ SOLN
INTRAMUSCULAR | Status: DC | PRN
  Administered 2014-12-09: 25 ug via INTRAVENOUS

## 2014-12-09 MED ORDER — MIDAZOLAM HCL 2 MG/2ML IJ SOLN
INTRAMUSCULAR | Status: DC | PRN
  Administered 2014-12-09 (×2): 1 mg via INTRAVENOUS

## 2014-12-09 MED ORDER — METOPROLOL TARTRATE 1 MG/ML IV SOLN
INTRAVENOUS | Status: DC | PRN
  Administered 2014-12-09: 10 mg via INTRAVENOUS

## 2014-12-09 MED ORDER — BIVALIRUDIN BOLUS VIA INFUSION - CUPID
INTRAVENOUS | Status: DC | PRN
  Administered 2014-12-09: 59.925 mg via INTRAVENOUS

## 2014-12-09 MED ORDER — ASPIRIN EC 325 MG PO TBEC
325.0000 mg | DELAYED_RELEASE_TABLET | Freq: Every day | ORAL | Status: DC
  Administered 2014-12-09 – 2014-12-10 (×2): 325 mg via ORAL
  Filled 2014-12-09: qty 1

## 2014-12-09 MED ORDER — AMLODIPINE BESYLATE 5 MG PO TABS
5.0000 mg | ORAL_TABLET | Freq: Every day | ORAL | Status: DC
  Administered 2014-12-09 – 2014-12-10 (×2): 5 mg via ORAL
  Filled 2014-12-09: qty 1

## 2014-12-09 MED ORDER — LABETALOL HCL 5 MG/ML IV SOLN
INTRAVENOUS | Status: DC | PRN
  Administered 2014-12-09: 10 mg via INTRAVENOUS

## 2014-12-09 MED ORDER — IOHEXOL 300 MG/ML  SOLN
INTRAMUSCULAR | Status: DC | PRN
  Administered 2014-12-09: 75 mL via INTRA_ARTERIAL
  Administered 2014-12-09: 150 mL via INTRA_ARTERIAL
  Administered 2014-12-09: 80 mL via INTRA_ARTERIAL

## 2014-12-09 MED ORDER — SODIUM CHLORIDE 0.9 % WEIGHT BASED INFUSION
3.0000 mL/kg/h | INTRAVENOUS | Status: AC
Start: 2014-12-09 — End: 2014-12-09
  Administered 2014-12-09: 3 mL/kg/h via INTRAVENOUS

## 2014-12-09 MED ORDER — NITROGLYCERIN 5 MG/ML IV SOLN
INTRAVENOUS | Status: AC
  Filled 2014-12-09: qty 10

## 2014-12-09 MED ORDER — SODIUM CHLORIDE 0.9 % IJ SOLN: 3.0000 mL | Freq: Two times a day (BID) | INTRAMUSCULAR | Status: DC

## 2014-12-09 MED ORDER — LABETALOL HCL 5 MG/ML IV SOLN
INTRAVENOUS | Status: AC
  Filled 2014-12-09: qty 4

## 2014-12-09 MED ORDER — SODIUM CHLORIDE 0.9 % IV SOLN: 250.0000 mg | INTRAVENOUS | Status: DC | PRN

## 2014-12-09 MED ORDER — ONDANSETRON HCL 4 MG/2ML IJ SOLN: 4.0000 mg | Freq: Four times a day (QID) | INTRAMUSCULAR | Status: DC | PRN

## 2014-12-09 MED ORDER — CLOPIDOGREL BISULFATE 75 MG PO TABS
75.0000 mg | ORAL_TABLET | Freq: Every day | ORAL | Status: DC
  Administered 2014-12-10: 75 mg via ORAL
  Filled 2014-12-09: qty 1

## 2014-12-09 MED ORDER — AMLODIPINE BESYLATE 5 MG PO TABS
ORAL_TABLET | ORAL | Status: AC
  Administered 2014-12-09: 5 mg via ORAL
  Filled 2014-12-09: qty 1

## 2014-12-09 MED ORDER — FENTANYL CITRATE (PF) 100 MCG/2ML IJ SOLN
INTRAMUSCULAR | Status: AC
  Filled 2014-12-09: qty 2

## 2014-12-09 MED ORDER — BIVALIRUDIN 250 MG IV SOLR
INTRAVENOUS | Status: AC
  Filled 2014-12-09: qty 250

## 2014-12-09 MED ORDER — ACETAMINOPHEN 325 MG PO TABS
650.0000 mg | ORAL_TABLET | ORAL | Status: DC | PRN
  Administered 2014-12-09: 650 mg via ORAL

## 2014-12-09 MED ORDER — SODIUM CHLORIDE 0.9 % IV SOLN: 250.0000 mL | INTRAVENOUS | Status: DC | PRN

## 2014-12-09 MED ORDER — ASPIRIN EC 325 MG PO TBEC
DELAYED_RELEASE_TABLET | ORAL | Status: AC
  Administered 2014-12-09: 325 mg via ORAL
  Filled 2014-12-09: qty 1

## 2014-12-09 SURGICAL SUPPLY — 15 items
BALLN TREK RX 3.0X15 (BALLOONS) ×2
BALLOON TREK RX 3.0X15 (BALLOONS) ×1 IMPLANT
CATH VISTA GUIDE 6FR JR4 (CATHETERS) ×2 IMPLANT
DEVICE CLOSURE MYNXGRIP 6/7F (Vascular Products) ×2 IMPLANT
DEVICE INFLAT 30 PLUS (MISCELLANEOUS) ×2 IMPLANT
DEVICE SAFEGUARD 24CM (GAUZE/BANDAGES/DRESSINGS) ×2 IMPLANT
KIT MANI 3VAL PERCEP (MISCELLANEOUS) ×2 IMPLANT
NEEDLE PERC 18GX7CM (NEEDLE) ×2 IMPLANT
PACK CARDIAC CATH (CUSTOM PROCEDURE TRAY) ×2 IMPLANT
SHEATH AVANTI 6FR X 11CM (SHEATH) ×2 IMPLANT
STENT XIENCE ALPINE RX 3.5X12 (Permanent Stent) ×2 IMPLANT
STENT XIENCE ALPINE RX 3.5X18 (Permanent Stent) ×2 IMPLANT
WIRE EMERALD 3MM-J .035X150CM (WIRE) ×2 IMPLANT
WIRE G HI TQ BMW 190 (WIRE) ×4 IMPLANT
WIRE HI TORQ TRAVERSE ST 190CM (WIRE) ×2 IMPLANT

## 2014-12-09 NOTE — Progress Notes (Signed)
Per Dr. Leslye Peer, cancel order for NS20K fluids, pt only to receive cath protocol of NS fluids.

## 2014-12-09 NOTE — Progress Notes (Signed)
Initial Nutrition Assessment    INTERVENTION:   Meals and Snacks: Cater to patient preferences   NUTRITION DIAGNOSIS:    No nutrition diagnosis at this time  GOAL:   Patient will meet greater than or equal to 90% of their needs  MONITOR:    (Energy Intake, Anthropometrics, Electrolyte/Renal profile, Digestive System , Anthropometrics)  REASON FOR ASSESSMENT:   Malnutrition Screening Tool    ASSESSMENT:    Pt admitted s/p fall, elevated troponin, NSTEMI, down for PCI on visit today  Past Medical History  Diagnosis Date  . Hypertension   . Diabetes mellitus without complication ([place])   . Nerve damage     Diet Order:  Diet clear liquid Room service appropriate?: Yes; Fluid consistency:: Thin   Energy Intake: po intake 90-100% of meals since admission on Heart Healthy/Carb Modified Diet  Skin:  Reviewed, no issues  Last BM:  11/3   Electrolyte and Renal Profile:  Recent Labs Lab 12/07/14 0408  BUN 14  CREATININE 0.97  NA 139  K 3.4*   Glucose Profile:  Recent Labs  12/08/14 2120 12/09/14 0751 12/09/14 1135  GLUCAP 119* 125* 89   Meds: reviewed  Height:   Ht Readings from Last 1 Encounters:  12/07/14 5\' 7"  (1.702 m)    Weight:   Wt Readings from Last 1 Encounters:  12/09/14 176 lb 1.6 oz (79.878 kg)   Filed Weights   12/08/14 0519 12/08/14 1538 12/09/14 0639  Weight: 178 lb 4.8 oz (80.876 kg) 178 lb 4.6 oz (80.87 kg) 176 lb 1.6 oz (79.878 kg)    BMI:  Body mass index is 27.57 kg/(m^2).  LOW Care Level  Kerman Passey MS, [place], LDN [phone] Pager

## 2014-12-09 NOTE — Progress Notes (Signed)
Patient ID: Andrew Chan, male   DOB: Jul 28, 1955, 59 y.o.   MRN: [id number] [facility] PROGRESS NOTE  PCP: No primary care provider on file.  HPI/Subjective: Patient came in after a fall and left leg weakness. Alcohol level was high when he came in. Found to have a borderline troponin without chest pain. He has some soreness in his groin. He states that his strength in his left leg is better.  Objective: Filed Vitals:   12/09/14 1101  BP: 166/100  Pulse: 87  Temp:   Resp:     Filed Weights   12/08/14 0519 12/08/14 1538 12/09/14 0639  Weight: 80.876 kg (178 lb 4.8 oz) 80.87 kg (178 lb 4.6 oz) 79.878 kg (176 lb 1.6 oz)    ROS: Review of Systems  Constitutional: Negative for fever, chills and malaise/fatigue.  Eyes: Negative for blurred vision.  Respiratory: Negative for cough and shortness of breath.   Cardiovascular: Negative for chest pain.  Gastrointestinal: Negative for nausea, vomiting, abdominal pain, diarrhea and constipation.  Genitourinary: Negative for dysuria.  Musculoskeletal: Negative for joint pain.  Neurological: Negative for dizziness, focal weakness and headaches.   Exam: Physical Exam  Constitutional: He is oriented to person, place, and time.  HENT:  Nose: No mucosal edema.  Mouth/Throat: No oropharyngeal exudate or posterior oropharyngeal edema.  Eyes: Conjunctivae, EOM and lids are normal. Pupils are equal, round, and reactive to light.  Neck: No JVD present. Carotid bruit is not present. No edema present. No thyroid mass and no thyromegaly present.  Cardiovascular: S1 normal and S2 normal.  Exam reveals no gallop.   No murmur heard. Pulses:      Dorsalis pedis pulses are 2+ on the right side, and 2+ on the left side.  Respiratory: No respiratory distress. He has no wheezes. He has no rhonchi. He has no rales.  GI: Soft. Bowel sounds are normal. There is no tenderness.  Musculoskeletal:       Right shoulder: He exhibits no swelling.   Lymphadenopathy:    He has no cervical adenopathy.  Neurological: He is alert and oriented to person, place, and time. No cranial nerve deficit.  Power 5 out of 5 bilateral upper and lower extremities  Skin: Skin is warm. No rash noted. Nails show no clubbing.  Psychiatric: He has a normal mood and affect.    Data Reviewed: Basic Metabolic Panel:  Recent Labs Lab 12/07/14 0408  NA 139  K 3.4*  CL 106  CO2 26  GLUCOSE 214*  BUN 14  CREATININE 0.97  CALCIUM 8.4*   CBC:  Recent Labs Lab 12/07/14 0408  WBC 8.3  HGB 10.9*  HCT 33.5*  MCV 91.2  PLT 338   Cardiac Enzymes:  Recent Labs Lab 12/07/14 0408 12/07/14 1058 12/07/14 1221  TROPONINI 0.15* 0.15* 0.15*    CBG:  Recent Labs Lab 12/08/14 1347 12/08/14 1634 12/08/14 2120 12/09/14 0751 12/09/14 1135  GLUCAP 85 81 119* 125* 89     Scheduled Meds: . aspirin EC  81 mg Oral Daily  . atorvastatin  80 mg Oral Daily  . citalopram  20 mg Oral Daily  . docusate sodium  100 mg Oral BID  . heparin  5,000 Units Subcutaneous 3 times per day  . hydrochlorothiazide  25 mg Oral Daily  . Influenza vac split quadrivalent PF  0.5 mL Intramuscular Tomorrow-1000  . insulin aspart  0-5 Units Subcutaneous QHS  . insulin aspart  0-9 Units Subcutaneous TID WC  . lisinopril  40 mg Oral Daily  . sodium chloride  3 mL Intravenous Q12H  . sodium chloride  3 mL Intravenous Q12H  . sodium chloride  3 mL Intravenous Q12H   Continuous Infusions: . 0.9 % NaCl with KCl 20 mEq / L Stopped (12/08/14 1100)  . sodium chloride 1 mL/kg/hr (12/09/14 0720)    Assessment/Plan:  1. Elevated troponin. Patient found to have coronary artery disease on cardiac catheter yesterday.  Patient to return for cardiac catheter today and stent. Continue Aspirin and atorvastatin. Hesitant on beta blocker at this time with history of cocaine in the past. Potential discharge tomorrow. 2. Type 2 diabetes- oral medications on hold. Patient is on  sliding scale insulin. 3. Essential hypertension- last blood pressure elevated. Continue hydrochlorothiazide and lisinopril.  Add low-dose Norvasc. 4. Hyperlipidemia unspecified continue atorvastatin 5. Depression continue Celexa 6. Left leg weakness and pain- could be neuropathy. Unable to get physical therapy to see him at this point since cardiology is still working on cardiac status.  Code Status:     Code Status Orders        Start     Ordered   12/08/14 1039  Full code   Continuous     12/08/14 1038     Disposition Plan: Potential discharge tomorrow  Consultants:  Cardiology  Procedures:  Cardiac catheterization  Time spent: 20 minutes, sister at bedside  [place], [place] [facility]

## 2014-12-09 NOTE — Progress Notes (Signed)
   SUBJECTIVE: Pt denies CP or SOB. No groin pain.    Filed Vitals:   12/09/14 0600 12/09/14 0639 12/09/14 0735 12/09/14 0742  BP: 139/89  155/106 158/95  Pulse: 84  86 85  Temp: 98.3 F (36.8 C)  98.3 F (36.8 C)   TempSrc: Oral  Oral   Resp: 16  20   Height:      Weight:  79.878 kg (176 lb 1.6 oz)    SpO2: 97%  99%     Intake/Output Summary (Last 24 hours) at 12/09/14 0855 Last data filed at 12/09/14 0720  Gross per 24 hour  Intake  482.7 ml  Output    950 ml  Net -467.3 ml    LABS: Basic Metabolic Panel:  Recent Labs  12/07/14 0408  NA 139  K 3.4*  CL 106  CO2 26  GLUCOSE 214*  BUN 14  CREATININE 0.97  CALCIUM 8.4*   Liver Function Tests: No results for input(s): AST, ALT, ALKPHOS, BILITOT, PROT, ALBUMIN in the last 72 hours. No results for input(s): LIPASE, AMYLASE in the last 72 hours. CBC:  Recent Labs  12/07/14 0408  WBC 8.3  HGB 10.9*  HCT 33.5*  MCV 91.2  PLT 338   Cardiac Enzymes:  Recent Labs  12/07/14 0408 12/07/14 1058 12/07/14 1221  TROPONINI 0.15* 0.15* 0.15*   BNP: Invalid input(s): POCBNP D-Dimer: No results for input(s): DDIMER in the last 72 hours. Hemoglobin A1C:  Recent Labs  12/07/14 0408  HGBA1C 11.6*   Fasting Lipid Panel: No results for input(s): CHOL, HDL, LDLCALC, TRIG, CHOLHDL, LDLDIRECT in the last 72 hours. Thyroid Function Tests:  Recent Labs  12/07/14 0408  TSH 2.034   Anemia Panel: No results for input(s): VITAMINB12, FOLATE, FERRITIN, TIBC, IRON, RETICCTPCT in the last 72 hours.   PHYSICAL EXAM General: Well developed, well nourished, in no acute distress HEENT:  Normocephalic and atramatic Neck:  No JVD.  Lungs: Clear bilaterally to auscultation and percussion. Heart: HRRR . Normal S1 and S2 without gallops or murmurs.  Abdomen: Bowel sounds are positive, abdomen soft and non-tender  Msk:  Back normal, normal gait. Normal strength and tone for age. Extremities: No clubbing, cyanosis or  edema. Inguinal areas CDI no hematoma or ecchymosis appreciated Neuro: Alert and oriented X 3. Psych:  Good affect, responds appropriately  TELEMETRY: Reviewed telemetry pt in NSR  ASSESSMENT AND PLAN:  1. NSTEMI: pt had elevated troponin, high grade lesion in mid RCA, moderate mid LAD disease. Will attempt PCI today as pt had hematoma yesterday. Pt given f/u 11/7 at 2pm.    Patient and plan discussed with supervising provider, Dr. Neoma Laming, who agrees with above findings.   [place] [facility]am [place], [place]  12/09/2014 8:55 AM

## 2014-12-09 NOTE — Progress Notes (Signed)
Remaining 20cc of air removed from PAD. No signs of bleeding.

## 2014-12-09 NOTE — Progress Notes (Signed)
20cc air of PAD removed. No signs of bleeding. Will continue to monitor before removing remainder of air and letting pt sit up.

## 2014-12-09 NOTE — Progress Notes (Addendum)
Pt doing well post heart cath with stent placement to rca, no bleeding nor hematoma at right groin site, pad device with 40 air in place to right groin, report given to care nurse with plan reviewed, stent/cardiac book with stent cards given to pt's sister who is here with pt at this time.

## 2014-12-09 NOTE — Progress Notes (Signed)
Patient without signs of any bleeding from R groin. Will let patient sit up per Cath Lab RN order of timing. Will continue to monitor.

## 2014-12-10 LAB — BASIC METABOLIC PANEL
ANION GAP: 8 (ref 5–15)
BUN: 9 mg/dL (ref 6–20)
CALCIUM: 7.9 mg/dL — AB (ref 8.9–10.3)
CHLORIDE: 103 mmol/L (ref 101–111)
CO2: 30 mmol/L (ref 22–32)
Creatinine, Ser: 0.97 mg/dL (ref 0.61–1.24)
GFR calc Af Amer: >60 mL/min (ref >60)
GFR calc non Af Amer: >60 mL/min (ref >60)
GLUCOSE: 123 mg/dL — AB (ref 65–99)
Potassium: 3.4 mmol/L — ABNORMAL LOW (ref 3.5–5.1)
Sodium: 141 mmol/L (ref 135–145)

## 2014-12-10 LAB — LIPID PANEL
Cholesterol: 198 mg/dL (ref 0–200)
HDL: 60 mg/dL (ref >40)
LDL CALC: 120 mg/dL — AB (ref 0–99)
Total CHOL/HDL Ratio: 3.3 RATIO
Triglycerides: 88 mg/dL (ref <150)
VLDL: 18 mg/dL (ref 0–40)

## 2014-12-10 LAB — CBC WITH DIFFERENTIAL/PLATELET
BASOS ABS: 0.1 10*3/uL (ref 0–0.1)
Basophils Relative: 1 %
Eosinophils Absolute: 0.1 10*3/uL (ref 0–0.7)
Eosinophils Relative: 2 %
HEMATOCRIT: 30.2 % — AB (ref 40.0–52.0)
Hemoglobin: 9.8 g/dL — ABNORMAL LOW (ref 13.0–18.0)
LYMPHS PCT: 24 %
Lymphs Abs: 1.3 10*3/uL (ref 1.0–3.6)
MCH: 29.3 pg (ref 26.0–34.0)
MCHC: 32.5 g/dL (ref 32.0–36.0)
MCV: 90.3 fL (ref 80.0–100.0)
MONO ABS: 0.6 10*3/uL (ref 0.2–1.0)
MONOS PCT: 10 %
NEUTROS ABS: 3.4 10*3/uL (ref 1.4–6.5)
Neutrophils Relative %: 63 %
Platelets: 304 10*3/uL (ref 150–440)
RBC: 3.34 MIL/uL — ABNORMAL LOW (ref 4.40–5.90)
RDW: 13.1 % (ref 11.5–14.5)
WBC: 5.5 10*3/uL (ref 3.8–10.6)

## 2014-12-10 LAB — GLUCOSE, CAPILLARY: Glucose-Capillary: 148 mg/dL — ABNORMAL HIGH (ref 65–99)

## 2014-12-10 MED ORDER — ASPIRIN 325 MG PO TBEC: 325.0000 mg | DELAYED_RELEASE_TABLET | Freq: Every day | ORAL | Status: DC

## 2014-12-10 MED ORDER — CLOPIDOGREL BISULFATE 75 MG PO TABS: 75.0000 mg | ORAL_TABLET | Freq: Every day | ORAL | Status: DC

## 2014-12-10 MED ORDER — AMLODIPINE BESYLATE 5 MG PO TABS: 5.0000 mg | ORAL_TABLET | Freq: Every day | ORAL | Status: DC

## 2014-12-10 NOTE — Care Management Note (Signed)
Case Management Note  Patient Details  Name: Andrew Chan MRN: [id number] Date of Birth: [birth year]  Subjective/Objective:    Andrew Chan goes to Outpatient PT at the [employer] at [facility], Dr Lucia Gaskins. Andrew Chan sister transports him to Outpatient PT.                 Action/Plan:   Expected Discharge Date:                  Expected Discharge Plan:     In-House Referral:     Discharge planning Services     Post Acute Care Choice:    Choice offered to:     DME Arranged:    DME Agency:     HH Arranged:    [name]:     Status of Service:     Medicare Important Message Given:    Date Medicare IM Given:    Medicare IM give by:    Date Additional Medicare IM Given:    Additional Medicare Important Message give by:     If discussed at [place] of Stay Meetings, dates discussed:    Additional Comments:  [name], RN 12/10/2014, 12:35 PM

## 2014-12-10 NOTE — Progress Notes (Signed)
Pt to be discharged to home today. Iv and tele removed. disch instructions given to pt to his understanding. disch via w.c. Accompanied by family member

## 2014-12-10 NOTE — Discharge Summary (Signed)
Andrew Chan, 59 y.o., DOB 1955/07/19, MRN [id number]. Admission date: 12/07/2014 Discharge Date 12/10/2014 Primary MD No primary care provider on file. Admitting Physician Harrie Foreman, MD  Admission Diagnosis  Acute coronary syndrome [facility]) [I24.9] Fall, initial encounter [W19.XXXA]  Discharge Diagnosis   Active Problems:   Elevated troponin   Chest pain   coronary artery disease status post stent to the RCA Essential hypertension Hyperlipidemia unspecified Depression Leg weakness and pain now improved Alcohol abuse        Hospital CourseThe patient presents emergency department via EMS after being on before and his home for an hour subsequent to a fall. Patient was admitted with this and was noted to have elevated troponin. He was seen by cardiology and plan for cardiac catheter. Patient had his catheter and developed a hematoma.  Next day he went back for the catheterization and had had a stent placed to the RCA. In terms of his weakness in the left leg was felt to be due to possible neuropathy his symptoms are much improved he was able to ambulate with physical therapy. He is strongly recommended not to drink.           Consults  cardiology  Significant Tests:  See full reports for all details      Dg Chest 2 View  12/07/2014  CLINICAL DATA:  Lower extremity swelling on the left. [place] and in the bathtub tonight. EXAM: CHEST  2 VIEW COMPARISON:  11/03/2012 FINDINGS: Unchanged moderate aortic tortuosity. Heart size is normal. The lungs are clear. There is no pleural effusion. Pulmonary vasculature is normal. No acute traumatic changes. IMPRESSION: No acute findings Electronically Signed   By: Andreas Newport M.D.   On: 12/07/2014 05:16   US Venous Img Lower Unilateral Left  12/07/2014  CLINICAL DATA:  Left lower extremity swelling for 2-3 weeks. EXAM: Left LOWER EXTREMITY VENOUS DOPPLER ULTRASOUND TECHNIQUE: Gray-scale sonography with graded compression, as well as  color Doppler and duplex ultrasound were performed to evaluate the lower extremity deep venous systems from the level of the common femoral vein and including the common femoral, femoral, profunda femoral, popliteal and calf veins including the posterior tibial, peroneal and gastrocnemius veins when visible. The superficial great saphenous vein was also interrogated. Spectral Doppler was utilized to evaluate flow at rest and with distal augmentation maneuvers in the common femoral, femoral and popliteal veins. COMPARISON:  None. FINDINGS: Contralateral Common Femoral Vein: Respiratory phasicity is normal and symmetric with the symptomatic side. No evidence of thrombus. Normal compressibility. Common Femoral Vein: No evidence of thrombus. Normal compressibility, respiratory phasicity and response to augmentation. Saphenofemoral Junction: No evidence of thrombus. Normal compressibility and flow on color Doppler imaging. Profunda Femoral Vein: No evidence of thrombus. Normal compressibility and flow on color Doppler imaging. Femoral Vein: No evidence of thrombus. Normal compressibility, respiratory phasicity and response to augmentation. Popliteal Vein: No evidence of thrombus. Normal compressibility, respiratory phasicity and response to augmentation. Calf Veins: Poor visibility of the peroneal vein. Posterior tibial vein is patent with normal flow. Superficial Great Saphenous Vein: No evidence of thrombus. Normal compressibility and flow on color Doppler imaging. Venous Reflux:  None. Other Findings:  None. IMPRESSION: No evidence of deep venous thrombosis. Electronically Signed   By: Andreas Newport M.D.   On: 12/07/2014 06:32       Today   Subjective:   Andrew Chan  feels well denies any chest pain or shortness of breath  Objective:   Blood pressure 161/93, pulse 93,  temperature 98.3 F (36.8 C), temperature source Oral, resp. rate 18, height 5\' 7"  (1.702 m), weight 79.334 kg (174 lb 14.4 oz), SpO2  98 %.  .  Intake/Output Summary (Last 24 hours) at 12/10/14 1420 Last data filed at 12/10/14 0859  Gross per 24 hour  Intake  923.3 ml  Output   1650 ml  Net -726.7 ml    Exam VITAL SIGNS: Blood pressure 161/93, pulse 93, temperature 98.3 F (36.8 C), temperature source Oral, resp. rate 18, height 5\' 7"  (1.702 m), weight 79.334 kg (174 lb 14.4 oz), SpO2 98 %.  GENERAL:  59 y.o.-year-old patient lying in the bed with no acute distress.  EYES: Pupils equal, round, reactive to light and accommodation. No scleral icterus. Extraocular muscles intact.  HEENT: Head atraumatic, normocephalic. Oropharynx and nasopharynx clear.  NECK:  Supple, no jugular venous distention. No thyroid enlargement, no tenderness.  LUNGS: Normal breath sounds bilaterally, no wheezing, rales,rhonchi or crepitation. No use of accessory muscles of respiration.  CARDIOVASCULAR: S1, S2 normal. No murmurs, rubs, or gallops.  ABDOMEN: Soft, nontender, nondistended. Bowel sounds present. No organomegaly or mass.  EXTREMITIES: No pedal edema, cyanosis, or clubbing. Right groin is healing well  NEUROLOGIC: Cranial nerves II through XII are intact. Muscle strength 5/5 in all extremities. Sensation intact. Gait not checked.  PSYCHIATRIC: The patient is alert and oriented x 3.  SKIN: No obvious rash, lesion, or ulcer.   Data Review     CBC w Diff: Lab Results  Component Value Date   WBC 5.5 12/10/2014   HGB 9.8* 12/10/2014   HCT 30.2* 12/10/2014   PLT 304 12/10/2014   LYMPHOPCT 24 12/10/2014   MONOPCT 10 12/10/2014   EOSPCT 2 12/10/2014   BASOPCT 1 12/10/2014   CMP: Lab Results  Component Value Date   NA 141 12/10/2014   K 3.4* 12/10/2014   CL 103 12/10/2014   CO2 30 12/10/2014   BUN 9 12/10/2014   CREATININE 0.97 12/10/2014   PROT 6.4* 09/06/2014   ALBUMIN 3.1* 09/06/2014   BILITOT 0.5 09/06/2014   ALKPHOS 58 09/06/2014   AST 24 09/06/2014   ALT 11* 09/06/2014  .  Micro Results No results found for  this or any previous visit (from the past 240 hour(s)).      Code Status Orders        Start     Ordered   12/09/14 1359  Full code   Continuous     12/09/14 1401          Follow-up Information    Follow up with Neoma Laming A, MD. Go on 12/12/2014.   Specialty:  Cardiology   Why:  Time:2:00 p.m.   Contact information:   [place] [id number] [phone]       Follow up with pcp In 7 days.      Discharge Medications     Medication List    TAKE these medications        amLODipine 5 MG tablet  Commonly known as:  NORVASC  Take 1 tablet (5 mg total) by mouth daily.     aspirin 325 MG EC tablet  Take 1 tablet (325 mg total) by mouth daily.     atorvastatin 80 MG tablet  Commonly known as:  LIPITOR  Take 80 mg by mouth daily.     citalopram 20 MG tablet  Commonly known as:  CELEXA  Take 20 mg by mouth daily.     clopidogrel 75  MG tablet  Commonly known as:  PLAVIX  Take 1 tablet (75 mg total) by mouth daily with breakfast.     glipiZIDE 5 MG 24 hr tablet  Commonly known as:  GLUCOTROL XL  Take 1 tablet (5 mg total) by mouth daily.     hydrochlorothiazide 25 MG tablet  Commonly known as:  HYDRODIURIL  Take 25 mg by mouth daily.     lisinopril 40 MG tablet  Commonly known as:  PRINIVIL,ZESTRIL  Take 40 mg by mouth daily.     metFORMIN 1000 MG tablet  Commonly known as:  GLUCOPHAGE  Take 1 tablet (1,000 mg total) by mouth 2 (two) times daily with a meal.     nitroGLYCERIN 0.4 MG SL tablet  Commonly known as:  NITROSTAT  Place 0.4 mg under the tongue every 5 (five) minutes as needed for chest pain.           Total Time in preparing paper work, data evaluation and todays exam - 35 minutes  Dustin Flock M.D on 12/10/2014 at 2:20 PM  [facility]   Office  [phone]

## 2014-12-10 NOTE — Plan of Care (Signed)
Problem: Acute Rehab PT Goals(only PT should resolve) Goal: Pt Will Transfer Bed To Chair/Chair To Bed And safely to assist patient in getting up for meals Goal: Pt Will Perform Standing Balance Or Pre-Gait For 15 seconds without loss of balance to help assist with ambulating on unlevel surfaces Goal: Pt Will Ambulate Without loss of balance to help assist with safe community ambulation

## 2014-12-10 NOTE — Progress Notes (Signed)
Physical Therapy Evaluation Patient Details Name: Andrew Chan MRN: [id number] DOB: Mar 27, 1955 Today's Date: 12/10/2014   History of Present Illness   (Pt. is a 59 year old male s/p cardiac catheterization 11/3.) Patient was admitted to [facility] on 12/07/14 due to a fall at home and was found to have elevated troponin level and did undergo a cardiac catheterization with stent placement on 12/08/14. Patient does have a history of neuropathy and reports that his L leg felt weak and his foot was swollen when he came into the hospital.   Clinical Impression  Patient presents with decreased mobility/gait and decreased balance. Patient was able to ambulate with a rolling walker for 276ft, with one loss of balance when initiating ambulation, which he was able to recover from with CGA. He does ambulate with a very narrow base of support, crossing his feet while walking. He denied any increased fatigue or left leg weakness during ambulation. He did ambulate for 3-4 feet in the room with CGA and no assistive device, but he may benefit from using a rolling walker at home. The patient may benefit from continued PT while at [facility] to address balance, overall strength and endurance and /gait as well as home health PT upon discharge from [facility].    Follow Up Recommendations Home health PT    Equipment Recommendations  Rolling walker with 5" wheels    Recommendations for Other Services       Precautions / Restrictions Precautions Precautions: Fall      Mobility  Bed Mobility Overal bed mobility: Independent                Transfers Overall transfer level: Needs assistance Equipment used: Rolling walker (2 wheeled)                Ambulation/Gait Ambulation/Gait assistance: Min guard Ambulation Distance (Feet): 200 Feet Assistive device: Rolling walker (2 wheeled) Gait Pattern/deviations: Narrow base of support (decreased gait speed)        Stairs            Wheelchair Mobility     Modified Rankin (Stroke Patients Only)       Balance   Sitting-balance support: No upper extremity supported       Standing balance support: Bilateral upper extremity supported   Standing balance comment: Pt. had one loss of balance when initiating ambulation, was able to recover with contact guard assist                             Pertinent Vitals/Pain Pain Assessment: No/denies pain    Home Living Family/patient expects to be discharged to:: Private residence Living Arrangements: Other relatives Available Help at Discharge: Family Type of Home: House Home Access: Level entry     Home Layout: One level        Prior Function Level of Independence: Independent               Hand Dominance        Extremity/Trunk Assessment   Upper Extremity Assessment: Overall WFL for tasks assessed           Lower Extremity Assessment: Overall WFL for tasks assessed         Communication      Cognition Arousal/Alertness: Awake/alert Behavior During Therapy: WFL for tasks assessed/performed Overall Cognitive Status: Within Functional Limits for tasks assessed  General Comments      Exercises        Assessment/Plan    PT Assessment Patient needs continued PT services  PT Diagnosis Abnormality of gait;Generalized weakness   PT Problem List Decreased balance;Decreased mobility  PT Treatment Interventions Gait training;Functional mobility training;Therapeutic activities;Therapeutic exercise;Balance training   PT Goals (Current goals can be found in the Care Plan section) Acute Rehab PT Goals Patient Stated Goal: To return home PT Goal Formulation: With patient Time For Goal Achievement: 12/24/14 Potential to Achieve Goals: Good    Frequency Min 2X/week   Barriers to discharge        Co-evaluation               End of Session Equipment Utilized During Treatment: Gait belt Activity Tolerance:  Patient tolerated treatment well;No increased pain Patient left: in bed;with call bell/phone within reach;with bed alarm set           Time: 0950-1002 PT Time Calculation (min) (ACUTE ONLY): 12 min   Charges:   PT Evaluation $Initial PT Evaluation Tier I: 1 Procedure     PT G CodesLadell Pier Emiliano Welshans Dec 16, 2014, 11:07 AM

## 2014-12-10 NOTE — Plan of Care (Signed)
Problem: Cardiac: Goal: Vascular access site(s) Level 0-1 will be maintained Outcome: Progressing Pt s/p Cath with 2 stents placed. Hematoma at Right groin has resolved. BP 120-150s/80-90s, NSR 80-90s, SpO2 > 90% on RA, pt denies any pain.

## 2014-12-10 NOTE — Discharge Instructions (Signed)
°  DIET:  Cardiac diet, carbohydrate conrtolled diet  DISCHARGE CONDITION:  Stable  ACTIVITY:  Activity as tolerated  OXYGEN:  Home Oxygen: No.   Oxygen Delivery: room air  DISCHARGE LOCATION:  home    ADDITIONAL DISCHARGE INSTRUCTION: resume metformin starting tomm,  Stop drinking etoh   If you experience worsening of your admission symptoms, develop shortness of breath, life threatening emergency, suicidal or homicidal thoughts you must seek medical attention immediately by calling 911 or calling your MD immediately  if symptoms less severe.  You Must read complete instructions/literature along with all the possible adverse reactions/side effects for all the Medicines you take and that have been prescribed to you. Take any new Medicines after you have completely understood and accpet all the possible adverse reactions/side effects.   Please note  You were cared for by a hospitalist during your hospital stay. If you have any questions about your discharge medications or the care you received while you were in the hospital after you are discharged, you can call the unit and asked to speak with the hospitalist on call if the hospitalist that took care of you is not available. Once you are discharged, your primary care physician will handle any further medical issues. Please note that NO REFILLS for any discharge medications will be authorized once you are discharged, as it is imperative that you return to your primary care physician (or establish a relationship with a primary care physician if you do not have one) for your aftercare needs so that they can reassess your need for medications and monitor your lab values.

## 2014-12-10 NOTE — Progress Notes (Signed)
   SUBJECTIVE: Pt feeling well this morning, no CP or SOB. States he is ready to go home.    Filed Vitals:   12/09/14 1959 12/10/14 0530 12/10/14 0600 12/10/14 0800  BP: 127/80 145/90  161/93  Pulse: 88 92  89  Temp: 98.6 F (37 C) 98.1 F (36.7 C)  98.3 F (36.8 C)  TempSrc:    Oral  Resp: 19 20  18   Height:      Weight:   79.334 kg (174 lb 14.4 oz)   SpO2: 98% 99%  98%    Intake/Output Summary (Last 24 hours) at 12/10/14 0958 Last data filed at 12/10/14 0859  Gross per 24 hour  Intake  923.3 ml  Output   1650 ml  Net -726.7 ml    LABS: Basic Metabolic Panel:  Recent Labs  12/10/14 0425  NA 141  K 3.4*  CL 103  CO2 30  GLUCOSE 123*  BUN 9  CREATININE 0.97  CALCIUM 7.9*   Liver Function Tests: No results for input(s): AST, ALT, ALKPHOS, BILITOT, PROT, ALBUMIN in the last 72 hours. No results for input(s): LIPASE, AMYLASE in the last 72 hours. CBC:  Recent Labs  12/10/14 0425  WBC 5.5  NEUTROABS 3.4  HGB 9.8*  HCT 30.2*  MCV 90.3  PLT 304   Cardiac Enzymes:  Recent Labs  12/07/14 1058 12/07/14 1221 12/09/14 1616  TROPONINI 0.15* 0.15* 0.05*   BNP: Invalid input(s): POCBNP D-Dimer: No results for input(s): DDIMER in the last 72 hours. Hemoglobin A1C: No results for input(s): HGBA1C in the last 72 hours. Fasting Lipid Panel:  Recent Labs  12/10/14 0425  CHOL 198  HDL 60  LDLCALC 120*  TRIG 88  CHOLHDL 3.3   Thyroid Function Tests: No results for input(s): TSH, T4TOTAL, T3FREE, THYROIDAB in the last 72 hours.  Invalid input(s): FREET3 Anemia Panel: No results for input(s): VITAMINB12, FOLATE, FERRITIN, TIBC, IRON, RETICCTPCT in the last 72 hours.   PHYSICAL EXAM General: Well developed, well nourished, in no acute distress HEENT: Normocephalic and atramatic Neck: No JVD.  Lungs: Clear bilaterally to auscultation and percussion. Heart: HRRR . Normal S1 and S2 without gallops or murmurs.  Abdomen: Bowel sounds are  positive, abdomen soft and non-tender  Msk: Back normal, normal gait. Normal strength and tone for age. Extremities: No clubbing, cyanosis or edema. Inguinal areas CDI no hematoma or ecchymosis appreciated Neuro: Alert and oriented X 3. Psych: Good affect, responds appropriately  TELEMETRY: Reviewed telemetry pt in NSR  ASSESSMENT AND PLAN:  1. NSTEMI: pt had elevated troponin, high grade lesion in mid RCA, moderate mid LAD disease. S/p successful PCI x3 to RCA yesterday. Pt ok to go home with asa, plavix, and statin. given f/u 11/7 at 2pm, importance of f/u compliance stressed to pt.     Patient and plan discussed with supervising provider, Dr. Neoma Laming, who agrees with above findings.   Andrew Chan, [place]  12/10/2014 9:58 AM

## 2014-12-12 ENCOUNTER — Encounter: Payer: Self-pay | Admitting: Internal Medicine

## 2014-12-27 DIAGNOSIS — F341 Dysthymic disorder: Secondary | ICD-10-CM | POA: Insufficient documentation

## 2014-12-27 DIAGNOSIS — I2510 Atherosclerotic heart disease of native coronary artery without angina pectoris: Secondary | ICD-10-CM | POA: Insufficient documentation

## 2015-11-14 DIAGNOSIS — Z789 Other specified health status: Secondary | ICD-10-CM | POA: Insufficient documentation

## 2015-11-14 DIAGNOSIS — Z7289 Other problems related to lifestyle: Secondary | ICD-10-CM | POA: Insufficient documentation

## 2015-11-17 DIAGNOSIS — Z550 Illiteracy and low-level literacy: Secondary | ICD-10-CM | POA: Insufficient documentation

## 2016-01-31 DIAGNOSIS — I5022 Chronic systolic (congestive) heart failure: Secondary | ICD-10-CM | POA: Insufficient documentation

## 2016-07-24 DIAGNOSIS — N185 Chronic kidney disease, stage 5: Secondary | ICD-10-CM | POA: Insufficient documentation

## 2016-12-20 DIAGNOSIS — Z5941 Food insecurity: Secondary | ICD-10-CM | POA: Insufficient documentation

## 2016-12-20 DIAGNOSIS — F1490 Cocaine use, unspecified, uncomplicated: Secondary | ICD-10-CM | POA: Insufficient documentation

## 2017-01-03 DIAGNOSIS — S81802A Unspecified open wound, left lower leg, initial encounter: Secondary | ICD-10-CM | POA: Insufficient documentation

## 2017-01-03 DIAGNOSIS — I82A22 Chronic embolism and thrombosis of left axillary vein: Secondary | ICD-10-CM | POA: Insufficient documentation

## 2017-01-23 DIAGNOSIS — R29898 Other symptoms and signs involving the musculoskeletal system: Secondary | ICD-10-CM | POA: Insufficient documentation

## 2017-02-19 DIAGNOSIS — D649 Anemia, unspecified: Secondary | ICD-10-CM | POA: Insufficient documentation

## 2017-03-06 DIAGNOSIS — E559 Vitamin D deficiency, unspecified: Secondary | ICD-10-CM | POA: Insufficient documentation

## 2017-03-10 DIAGNOSIS — N189 Chronic kidney disease, unspecified: Secondary | ICD-10-CM | POA: Insufficient documentation

## 2017-03-10 DIAGNOSIS — Z862 Personal history of diseases of the blood and blood-forming organs and certain disorders involving the immune mechanism: Secondary | ICD-10-CM | POA: Insufficient documentation

## 2019-01-25 DIAGNOSIS — D689 Coagulation defect, unspecified: Secondary | ICD-10-CM | POA: Insufficient documentation

## 2019-01-25 DIAGNOSIS — L299 Pruritus, unspecified: Secondary | ICD-10-CM | POA: Insufficient documentation

## 2019-01-25 DIAGNOSIS — Z992 Dependence on renal dialysis: Secondary | ICD-10-CM | POA: Insufficient documentation

## 2019-01-25 DIAGNOSIS — N186 End stage renal disease: Secondary | ICD-10-CM | POA: Insufficient documentation

## 2019-01-25 DIAGNOSIS — I509 Heart failure, unspecified: Secondary | ICD-10-CM | POA: Insufficient documentation

## 2019-01-25 DIAGNOSIS — D509 Iron deficiency anemia, unspecified: Secondary | ICD-10-CM | POA: Insufficient documentation

## 2019-01-25 DIAGNOSIS — E1122 Type 2 diabetes mellitus with diabetic chronic kidney disease: Secondary | ICD-10-CM | POA: Insufficient documentation

## 2019-01-25 DIAGNOSIS — N2581 Secondary hyperparathyroidism of renal origin: Secondary | ICD-10-CM | POA: Insufficient documentation

## 2019-01-25 DIAGNOSIS — E441 Mild protein-calorie malnutrition: Secondary | ICD-10-CM | POA: Insufficient documentation

## 2019-01-25 DIAGNOSIS — D631 Anemia in chronic kidney disease: Secondary | ICD-10-CM | POA: Insufficient documentation

## 2019-01-25 DIAGNOSIS — N189 Chronic kidney disease, unspecified: Secondary | ICD-10-CM | POA: Insufficient documentation

## 2019-01-25 DIAGNOSIS — R52 Pain, unspecified: Secondary | ICD-10-CM | POA: Insufficient documentation

## 2019-02-03 DIAGNOSIS — Z23 Encounter for immunization: Secondary | ICD-10-CM | POA: Insufficient documentation

## 2019-02-25 ENCOUNTER — Ambulatory Visit: Payer: Self-pay | Admitting: Family Medicine

## 2019-03-10 ENCOUNTER — Other Ambulatory Visit: Payer: Self-pay | Admitting: Nephrology

## 2019-03-10 DIAGNOSIS — R252 Cramp and spasm: Secondary | ICD-10-CM

## 2019-03-10 DIAGNOSIS — I739 Peripheral vascular disease, unspecified: Secondary | ICD-10-CM

## 2019-03-19 ENCOUNTER — Ambulatory Visit: Payer: Self-pay | Admitting: Family Medicine

## 2019-03-30 ENCOUNTER — Ambulatory Visit
Admission: RE | Admit: 2019-03-30 | Discharge: 2019-03-30 | Disposition: A | Discharge status: Home / Self Care | Payer: Self-pay | Source: Ambulatory Visit | Attending: Nephrology | Admitting: Nephrology

## 2019-03-30 ENCOUNTER — Encounter: Payer: Self-pay | Admitting: *Deleted

## 2019-03-30 ENCOUNTER — Other Ambulatory Visit: Payer: Self-pay | Admitting: Interventional Radiology

## 2019-03-30 DIAGNOSIS — I739 Peripheral vascular disease, unspecified: Secondary | ICD-10-CM

## 2019-03-30 DIAGNOSIS — R252 Cramp and spasm: Secondary | ICD-10-CM

## 2019-03-30 HISTORY — PX: IR RADIOLOGIST EVAL & MGMT: IMG5224

## 2019-03-30 NOTE — Consult Note (Addendum)
Chief Complaint: Right leg pain  Referring Physician(s): Upton,Elizabeth  History of Present Illness: Andrew Chan is a 64 y.o. male presenting today to [facility], kindly referred by Dr. Hollie Salk, for evaluation of right leg pain potential etiology vascular disease.   Andrew Tozzi joins [place] today in the clinic with his sister.    He tells me that he started having right leg pain sometime around Christmas of 2020.  He states the pain is in his hip, extending to his right knee, and is present a few times a week.  This is typically when he walks long distances, though he says that it sometimes wakes him at night time. Some days he is able to walk just fine without any pain, including long distances.  He denies any foot pain, saying that because of neuropathy, he does not feel very much in his foot.   He denies pain in his hip when he gets out of chair.   He denies every having a right foot wound/ulcer.    He tells me he was diagnosed a few years ago at [facility] with a heart attack.  He did not have cardiac surgery.  He denies ever having a stroke.  He denies any current symptoms of chest pain or SOB.    He is on dialysis, which he tells me started just last year "before the fourth."  His right UE radio-cephalic fistula was placed at [facility] last year.  He says it has not ever required an intervention.  He receives dialysis M/W/F.    CV risk factors:  DM, ESRD, known CAD, HTN.   He denies smoking.    Currently he does not have a PCP, and he says he is trying to enter the [facility] program.   Non invasive exam performed today: Right TBI: 0.73 Left TBI: 0.89\ The ABI's are not compressible Segmental shows triphasic right AT/DP, with monophasic PT Segmental shows triphasic left AT and PT.    Past Medical History:  Diagnosis Date  . Diabetes mellitus without complication ([place])   . Hypertension   . Nerve damage     Past Surgical History:  Procedure Laterality Date  . CARDIAC CATHETERIZATION  Left 12/08/2014   Procedure: Left Heart Cath and Coronary Angiography;  Surgeon: Dionisio David, MD;  Location: [facility];  Service: Cardiovascular;  Laterality: Left;  . CARDIAC CATHETERIZATION N/A 12/09/2014   Procedure: Coronary Stent Intervention;  Surgeon: Yolonda Kida, MD;  Location: [facility];  Service: Cardiovascular;  Laterality: N/A;  . none      Allergies: Patient has no known allergies.  Medications: Prior to Admission medications   Medication Sig Start Date End Date Taking? Authorizing Provider  amLODipine (NORVASC) 5 MG tablet Take 1 tablet (5 mg total) by mouth daily. 12/10/14   Dustin Flock, MD  aspirin EC 325 MG EC tablet Take 1 tablet (325 mg total) by mouth daily. 12/10/14   Dustin Flock, MD  atorvastatin (LIPITOR) 80 MG tablet Take 80 mg by mouth daily.    [provider]  citalopram (CELEXA) 20 MG tablet Take 20 mg by mouth daily.    [provider]  clopidogrel (PLAVIX) 75 MG tablet Take 1 tablet (75 mg total) by mouth daily with breakfast. 12/10/14   Dustin Flock, MD  glipiZIDE (GLUCOTROL XL) 5 MG 24 hr tablet Take 1 tablet (5 mg total) by mouth daily. 09/07/14   Gregor Hams, MD  hydrochlorothiazide (HYDRODIURIL) 25 MG tablet Take  25 mg by mouth daily.    [provider]  lisinopril (PRINIVIL,ZESTRIL) 40 MG tablet Take 40 mg by mouth daily.    [provider]  metFORMIN (GLUCOPHAGE) 1000 MG tablet Take 1 tablet (1,000 mg total) by mouth 2 (two) times daily with a meal. 09/07/14   Gregor Hams, MD  nitroGLYCERIN (NITROSTAT) 0.4 MG SL tablet Place 0.4 mg under the tongue every 5 (five) minutes as needed for chest pain.    [provider]     Family History  Problem Relation Age of Onset  . CAD Brother   . CAD Mother   . Diabetes Mellitus II Sister     Social History   Socioeconomic History  . Marital status: Single    Spouse name: Not on file  . Number of children: Not on file    . Years of education: Not on file  . Highest education level: Not on file  Occupational History  . Not on file  Tobacco Use  . Smoking status: Never Smoker  Substance and Sexual Activity  . Alcohol use: Yes  . Drug use: Yes    Types: Cocaine  . Sexual activity: Not on file  Other Topics Concern  . Not on file  Social History Narrative  . Not on file   Social Determinants of Health   Financial Resource Strain:   . Difficulty of Paying Living Expenses: Not on file  Food Insecurity:   . Worried About Charity fundraiser in the Last Year: Not on file  . Ran Out of Food in the Last Year: Not on file  Transportation Needs:   . Lack of Transportation (Medical): Not on file  . Lack of Transportation (Non-Medical): Not on file  Physical Activity:   . Days of Exercise per Week: Not on file  . Minutes of Exercise per Session: Not on file  Stress:   . Feeling of Stress : Not on file  Social Connections:   . Frequency of Communication with Friends and Family: Not on file  . Frequency of Social Gatherings with Friends and Family: Not on file  . Attends Religious Services: Not on file  . Active Member of Clubs or Organizations: Not on file  . Attends Archivist Meetings: Not on file  . Marital Status: Not on file       Review of Systems: A 12 point ROS discussed and pertinent positives are indicated in the HPI above.  All other systems are negative.  Review of Systems  Vital Signs: BP 137/86 (BP Location: Left Arm)   Pulse 81   SpO2 99%   Physical Exam General: 64 yo male appearing stated age.  Well-developed, well-nourished.  No distress. HEENT: Atraumatic, normocephalic.  Conjugate gaze, extra-ocular motor intact. No scleral icterus or scleral injection. No lesions on external ears, nose, lips, or gums.  Oral mucosa moist, pink.  Neck: Symmetric with no goiter enlargement.  Chest/Lungs:  Symmetric chest with inspiration/expiration.  No labored breathing.      Heart:   No JVD appreciated.  Abdomen:  Soft, NT/ND, with + bowel sounds.   Genito-urinary: Deferred Neurologic: Alert & Oriented to person, place, and time.   Normal affect and insight.  Appropriate questions.  Moving all 4 extremities with gross sensory intact.  Pulse Exam:  Doppler positive at the right and left DP and PT.  Extremities: RUE radiocephalic fistula, with excellent thrill.  No aneurysm.  No redness or tenderness.  No wound of  lower extremities.  No dependent rubor.  Warm extremities    Mallampati Score:  2 Imaging: No results found.  Labs:  CBC: No results for input(s): WBC, HGB, HCT, PLT in the last 8760 hours.  COAGS: No results for input(s): INR, APTT in the last 8760 hours.  BMP: No results for input(s): NA, K, CL, CO2, GLUCOSE, BUN, CALCIUM, CREATININE, GFRNONAA, GFRAA in the last 8760 hours.  Invalid input(s): CMP  LIVER FUNCTION TESTS: No results for input(s): BILITOT, AST, ALT, ALKPHOS, PROT, ALBUMIN in the last 8760 hours.  TUMOR MARKERS: No results for input(s): AFPTM, CEA, CA199, CHROMGRNA in the last 8760 hours.  Assessment and Plan:  Assessment:  Andrew Chan is a 43 male with multiple CV risk factors presenting with right hip and thigh pain, though with atypical history for vascular claudication.   His non-invasive/ABI shows non-compressible ABI, with triphasic PT and monophasic AT.    On the left, his ABI is non-compressible with triphasic waveforms.   I suspect that his right thigh symptoms are not vascular related, which I did share with him.  The exception would be if he has pelvic vascular disease/hypogastric disease.    I did let him know that he has developing tibial disease on the right, but this does not account for his symptoms, essentially [place] 0.    He is receiving foot checks at his [facility] center monthly, he tells me.    Regarding medical management, maximal medical therapy for reduction of risk factors is indicated as  recommended by updated AHA guidelines1.  This includes anti-platelet medication, tight blood glucose control to a HbA1c < 7, tight blood pressure control, maximum-dose HMG-CoA reductase inhibitor.    Annual flu vaccination is also recommended, with Class 1 recommendation1.   I did suggest that we get CTA of the abd/pelvis to potentially identify hypogastric arterial disease as etiology, as well as to look at the spine for any gross evidence that spinal claudication may be causing his symptoms.   Plan: -Cross-sectional anatomic imaging with CTA abdomen/pelvis to investigate pelvic arteries as well as for any gross spine pathology.  -Recommend maximal medical therapy for cardiovascular risk reduction, including anti-platelet therapy.  I have encouraged them to continue seeking a PCP.  -Observe healthy foot care habits, given the presence of diabetes, with at least annual foot inspection.  He says he gets monthly checks at [facility]. - Given his diagnosis of early PAD, annual surveillance ABI is recommended.  We will set up his ABI for 1 year.  -Annual flu vaccination is recommended in the setting of known PAD, in the absence of contra-indications.    ___________________________________________________________________   1Morley Kos MD, et al. 2016 AHA/ACC Guideline on the Management of Patients With Lower Extremity Peripheral Artery Disease: Executive Summary: A Report of the American College of Cardiology/American Heart Association Task Force on Clinical Practice Guidelines. J Am Coll Cardiol. 2017 Mar 21;69(11):1465-1508. doi: 10.1016/j.jacc.2016.11.008.   2 - Norgren L, et al. TASC II Working Group. Inter-society consensus for the management of peripheral arterial disease. Int Tressia Miners. 2007 Jun;26(2):81-157. Review. PubMed PMID: 41962229  3 - Hingorani A, et al. The management of diabetic foot: A clinical practice guideline by the Society for Vascular Surgery in collaboration with the  [place] and the Society  for Vascular Medicine. J Vasc Surg. 2016 Feb;63(2 Suppl):3S-21S. doi: 10.1016/j.jvs.2015.10.003. PubMed PMID: 79892119.  4 - Corinna Gab, Saab FA, Luberta Mutter, Grant Ruts, Ewell Poe, Driver VR, Reece Agar, Altamese Cabal  Baldemar Lenis, Jaff Andrew, Guadalupe Dawn, Henao S, AlMahameed A, Katzen B. Digital Subtraction Angiography Prior to an Amputation for Critical Limb Ischemia (CLI): An Expert Recommendation Statement From the CLI Global Society to Optimize Limb Salvage. J Endovasc Ther. 2020 Aug;27(4):540-546. doi: 10.1177/1526602820928590. Epub 2020 May 29. PMID: 23343568.    Thank you for this interesting consult.  I greatly enjoyed meeting Ralpheal Zappone and look forward to participating in their care.  A copy of this report was sent to the requesting provider on this date.  Electronically Signed: Corrie Mckusick 03/30/2019, 12:15 PM   I spent a total of  60 Minutes   in face to face in clinical consultation, greater than 50% of which was counseling/coordinating care for right leg pain, possible vascular claudication, CV risk factors.

## 2019-03-31 ENCOUNTER — Other Ambulatory Visit: Payer: Self-pay | Admitting: Interventional Radiology

## 2019-03-31 DIAGNOSIS — I739 Peripheral vascular disease, unspecified: Secondary | ICD-10-CM

## 2019-03-31 DIAGNOSIS — R252 Cramp and spasm: Secondary | ICD-10-CM

## 2019-04-12 ENCOUNTER — Other Ambulatory Visit: Payer: Self-pay | Admitting: *Deleted

## 2019-04-12 DIAGNOSIS — M25561 Pain in right knee: Secondary | ICD-10-CM

## 2019-04-12 DIAGNOSIS — M25562 Pain in left knee: Secondary | ICD-10-CM

## 2019-04-13 ENCOUNTER — Ambulatory Visit (HOSPITAL_COMMUNITY): Admission: RE | Admit: 2019-04-13 | Payer: Self-pay | Source: Ambulatory Visit

## 2019-04-15 ENCOUNTER — Other Ambulatory Visit: Payer: Self-pay

## 2019-04-15 ENCOUNTER — Ambulatory Visit
Admission: RE | Admit: 2019-04-15 | Discharge: 2019-04-15 | Disposition: A | Discharge status: Home / Self Care | Payer: Medicaid Other | Source: Ambulatory Visit | Attending: Interventional Radiology | Admitting: Interventional Radiology

## 2019-04-15 DIAGNOSIS — R252 Cramp and spasm: Secondary | ICD-10-CM

## 2019-04-15 DIAGNOSIS — I739 Peripheral vascular disease, unspecified: Secondary | ICD-10-CM

## 2019-05-18 ENCOUNTER — Other Ambulatory Visit (HOSPITAL_COMMUNITY): Payer: Self-pay | Admitting: Nephrology

## 2019-05-18 DIAGNOSIS — N186 End stage renal disease: Secondary | ICD-10-CM

## 2019-05-19 ENCOUNTER — Other Ambulatory Visit: Payer: Self-pay | Admitting: Student

## 2019-05-19 ENCOUNTER — Other Ambulatory Visit: Payer: Self-pay | Admitting: Radiology

## 2019-05-20 ENCOUNTER — Ambulatory Visit (HOSPITAL_COMMUNITY)
Admission: RE | Admit: 2019-05-20 | Discharge: 2019-05-20 | Disposition: A | Discharge status: Home / Self Care | Payer: Medicaid Other | Source: Ambulatory Visit | Attending: Nephrology | Admitting: Nephrology

## 2019-05-20 ENCOUNTER — Other Ambulatory Visit: Payer: Self-pay

## 2019-05-20 DIAGNOSIS — I2510 Atherosclerotic heart disease of native coronary artery without angina pectoris: Secondary | ICD-10-CM | POA: Insufficient documentation

## 2019-05-20 DIAGNOSIS — Z79899 Other long term (current) drug therapy: Secondary | ICD-10-CM | POA: Diagnosis not present

## 2019-05-20 DIAGNOSIS — N186 End stage renal disease: Secondary | ICD-10-CM | POA: Insufficient documentation

## 2019-05-20 DIAGNOSIS — E1151 Type 2 diabetes mellitus with diabetic peripheral angiopathy without gangrene: Secondary | ICD-10-CM | POA: Insufficient documentation

## 2019-05-20 DIAGNOSIS — Z7982 Long term (current) use of aspirin: Secondary | ICD-10-CM | POA: Diagnosis not present

## 2019-05-20 DIAGNOSIS — Z794 Long term (current) use of insulin: Secondary | ICD-10-CM | POA: Diagnosis not present

## 2019-05-20 DIAGNOSIS — Z7902 Long term (current) use of antithrombotics/antiplatelets: Secondary | ICD-10-CM | POA: Diagnosis not present

## 2019-05-20 DIAGNOSIS — Z992 Dependence on renal dialysis: Secondary | ICD-10-CM | POA: Diagnosis not present

## 2019-05-20 DIAGNOSIS — I120 Hypertensive chronic kidney disease with stage 5 chronic kidney disease or end stage renal disease: Secondary | ICD-10-CM | POA: Insufficient documentation

## 2019-05-20 DIAGNOSIS — E1122 Type 2 diabetes mellitus with diabetic chronic kidney disease: Secondary | ICD-10-CM | POA: Insufficient documentation

## 2019-05-20 HISTORY — PX: IR DIALY SHUNT INTRO NEEDLE/INTRACATH INITIAL W/IMG RIGHT: IMG6115

## 2019-05-20 LAB — GLUCOSE, CAPILLARY: Glucose-Capillary: 142 mg/dL — ABNORMAL HIGH (ref 70–99)

## 2019-05-20 LAB — POCT I-STAT, CHEM 8
BUN: 33 mg/dL — ABNORMAL HIGH (ref 8–23)
Calcium, Ion: 1.09 mmol/L — ABNORMAL LOW (ref 1.15–1.40)
Chloride: 94 mmol/L — ABNORMAL LOW (ref 98–111)
Creatinine, Ser: 5 mg/dL — ABNORMAL HIGH (ref 0.61–1.24)
Glucose, Bld: 198 mg/dL — ABNORMAL HIGH (ref 70–99)
HCT: 36 % — ABNORMAL LOW (ref 39.0–52.0)
Hemoglobin: 12.2 g/dL — ABNORMAL LOW (ref 13.0–17.0)
Potassium: 3.7 mmol/L (ref 3.5–5.1)
Sodium: 141 mmol/L (ref 135–145)
TCO2: 37 mmol/L — ABNORMAL HIGH (ref 22–32)

## 2019-05-20 MED ORDER — SODIUM CHLORIDE 0.9 % IV SOLN: INTRAVENOUS | Status: DC

## 2019-05-20 MED ORDER — IOHEXOL 300 MG/ML  SOLN
100.0000 mL | Freq: Once | INTRAMUSCULAR | Status: AC | PRN
  Administered 2019-05-20: 52 mL via INTRAVENOUS

## 2019-05-20 NOTE — H&P (Signed)
Referring Physician(s): Upton,Elizabeth  Supervising Physician: Markus Daft  Patient Status:  [facility] OP  Chief Complaint: "I'm here for them to look at my fistula"   Subjective: Patient familiar to IR service from recent consultation with Dr. Earleen Newport on 03/30/2019 to discuss possible vascular etiology of right leg pain.  He has a history of end-stage renal disease, coronary artery disease, peripheral vascular disease, diabetes, hypertension and right upper extremity radiocephalic fistula which was placed at [facility] last year.  He is on a Monday Wednesday Friday dialysis schedule.  He states that last week there was difficulty cannulating fistula however this week there have been no issues with his dialysis sessions.  He presents today for right extremity fistulogram and possible intervention if needed.  Of note patient ate breakfast this morning at 8 AM therefore conscious sedation will not be utilized for case .  He denies fever, headache, chest pain, dyspnea, cough, abdominal/back pain, nausea, vomiting or bleeding.  Past Medical History:  Diagnosis Date  . Diabetes mellitus without complication ([place])   . Hypertension   . Nerve damage    Past Surgical History:  Procedure Laterality Date  . CARDIAC CATHETERIZATION Left 12/08/2014   Procedure: Left Heart Cath and Coronary Angiography;  Surgeon: Dionisio David, MD;  Location: [facility];  Service: Cardiovascular;  Laterality: Left;  . CARDIAC CATHETERIZATION N/A 12/09/2014   Procedure: Coronary Stent Intervention;  Surgeon: Yolonda Kida, MD;  Location: [facility];  Service: Cardiovascular;  Laterality: N/A;  . IR RADIOLOGIST EVAL & MGMT  03/30/2019  . none        Allergies: Patient has no known allergies.  Medications: Prior to Admission medications   Medication Sig Start Date End Date Taking? Authorizing Provider  acetaminophen (TYLENOL) 500 MG tablet Take 500 mg by mouth every 6 (six) hours as needed for  moderate pain or headache.   Yes [provider]  insulin glargine (SEMGLEE) 100 UNIT/ML injection Inject 4 Units into the skin at bedtime as needed (for blood sugar over 150).   Yes [provider]  loratadine (CLARITIN) 10 MG tablet Take 10 mg by mouth daily as needed for allergies.   Yes [provider]  sucroferric oxyhydroxide (VELPHORO) 500 MG chewable tablet Chew 500 mg by mouth See admin instructions. Take 500 mg with each meal and 500 mg with each snack   Yes [provider]  amLODipine (NORVASC) 5 MG tablet Take 1 tablet (5 mg total) by mouth daily. Patient not taking: Reported on 05/19/2019 12/10/14   Dustin Flock, MD  aspirin EC 325 MG EC tablet Take 1 tablet (325 mg total) by mouth daily. Patient not taking: Reported on 05/19/2019 12/10/14   Dustin Flock, MD  clopidogrel (PLAVIX) 75 MG tablet Take 1 tablet (75 mg total) by mouth daily with breakfast. Patient not taking: Reported on 05/19/2019 12/10/14   Dustin Flock, MD  glipiZIDE (GLUCOTROL XL) 5 MG 24 hr tablet Take 1 tablet (5 mg total) by mouth daily. Patient not taking: Reported on 05/19/2019 09/07/14   Gregor Hams, MD  metFORMIN (GLUCOPHAGE) 1000 MG tablet Take 1 tablet (1,000 mg total) by mouth 2 (two) times daily with a meal. Patient not taking: Reported on 05/19/2019 09/07/14   Gregor Hams, MD     Vital Signs: BP 138/82   Pulse 96   Temp 98 F (36.7 C) (Tympanic)   Ht 5\' 8"  (1.727 m)   Wt 158 lb (71.7 kg)  SpO2 97%   BMI 24.02 kg/m   Physical Exam awake, alert.  Chest clear to auscultation bilaterally.  Heart with regular rate and rhythm.  Abdomen soft, positive bowel sounds, nontender.  Right upper extremity fistulogram with good thrill/bruit.  Trace pretibial edema bilaterally.  Imaging: No results found.  Labs:  CBC: No results for input(s): WBC, HGB, HCT, PLT in the last 8760 hours.  COAGS: No results for input(s): INR, APTT in the last 8760  hours.  BMP: No results for input(s): NA, K, CL, CO2, GLUCOSE, BUN, CALCIUM, CREATININE, GFRNONAA, GFRAA in the last 8760 hours.  Invalid input(s): CMP  LIVER FUNCTION TESTS: No results for input(s): BILITOT, AST, ALT, ALKPHOS, PROT, ALBUMIN in the last 8760 hours.  Assessment and Plan: 64 yo male with history of end-stage renal disease, coronary artery disease, peripheral vascular disease, diabetes, hypertension and right upper extremity radiocephalic fistula which was placed at [facility] last year.  He is on a Monday Wednesday Friday dialysis schedule.  He states that last week there was difficulty cannulating fistula, however this week there have been no issues with his dialysis sessions.  He presents today for right extremity fistulogram and possible intervention if needed.  Of note, patient ate breakfast this morning at 8 AM therefore conscious sedation will not be utilized for case .  Details/risk of procedure, including not limited to, internal bleeding, infection, injury to adjacent structures, discussed with patient with his understanding and consent.  He also understands that no intervention may be necessary today.    Electronically Signed: D. Rowe Robert, PA-C 05/20/2019, 11:22 AM   I spent a total of 25 minutes at the the patient's bedside AND on the patient's hospital floor or unit, greater than 50% of which was counseling/coordinating care for right upper extremity fistulogram with possible endovascular intervention

## 2019-05-20 NOTE — Progress Notes (Signed)
Patient stated he ate full breakfast at 0800. Lennette Bihari, [place] notified

## 2019-05-20 NOTE — Procedures (Signed)
Interventional Radiology Procedure:   Indications: Concern for fistula malfunction  Procedure: Right arm fistulogram  Findings: Right arm fistula is widely patent.  No intervention.   Complications: None     EBL: less than 10 ml  Plan: Fistula is ready to be used.     Janmarie Smoot R. Anselm Pancoast, MD  Pager: [phone]

## 2019-08-05 DIAGNOSIS — Z2839 Other underimmunization status: Secondary | ICD-10-CM | POA: Insufficient documentation

## 2019-08-05 DIAGNOSIS — Z283 Underimmunization status: Secondary | ICD-10-CM | POA: Insufficient documentation

## 2019-09-27 ENCOUNTER — Telehealth: Payer: Self-pay | Admitting: Internal Medicine

## 2019-09-27 NOTE — Telephone Encounter (Signed)
Left message for patient to call and reschedule Wednesday 09/29/19 9:00 am appointment to Friday 10/01/19 per Dr. Margaretann Loveless

## 2019-09-29 ENCOUNTER — Ambulatory Visit: Payer: Medicaid Other | Admitting: Internal Medicine

## 2019-10-14 DIAGNOSIS — T782XXA Anaphylactic shock, unspecified, initial encounter: Secondary | ICD-10-CM | POA: Insufficient documentation

## 2019-10-14 DIAGNOSIS — T7840XA Allergy, unspecified, initial encounter: Secondary | ICD-10-CM | POA: Insufficient documentation

## 2019-11-02 ENCOUNTER — Other Ambulatory Visit: Payer: Self-pay

## 2019-11-02 ENCOUNTER — Ambulatory Visit (INDEPENDENT_AMBULATORY_CARE_PROVIDER_SITE_OTHER): Payer: Medicaid Other | Admitting: Internal Medicine

## 2019-11-02 ENCOUNTER — Encounter: Payer: Self-pay | Admitting: Internal Medicine

## 2019-11-02 VITALS — BP 104/80 | HR 81 | Ht 68.0 in | Wt 162.8 lb

## 2019-11-02 DIAGNOSIS — Z86718 Personal history of other venous thrombosis and embolism: Secondary | ICD-10-CM | POA: Diagnosis not present

## 2019-11-02 DIAGNOSIS — I5022 Chronic systolic (congestive) heart failure: Secondary | ICD-10-CM | POA: Diagnosis not present

## 2019-11-02 DIAGNOSIS — I2510 Atherosclerotic heart disease of native coronary artery without angina pectoris: Secondary | ICD-10-CM

## 2019-11-02 DIAGNOSIS — Z95810 Presence of automatic (implantable) cardiac defibrillator: Secondary | ICD-10-CM | POA: Diagnosis not present

## 2019-11-02 DIAGNOSIS — Z7901 Long term (current) use of anticoagulants: Secondary | ICD-10-CM

## 2019-11-02 DIAGNOSIS — N186 End stage renal disease: Secondary | ICD-10-CM | POA: Diagnosis not present

## 2019-11-02 NOTE — Patient Instructions (Signed)
Medication Instructions:  No Changes In Medications at this time.  *If you need a refill on your cardiac medications before your next appointment, please call your pharmacy*  Lab Work: None Ordered At This Time.  If you have labs (blood work) drawn today and your tests are completely normal, you will receive your results only by: Marland Kitchen MyChart Message (if you have MyChart) OR . A paper copy in the mail If you have any lab test that is abnormal or we need to change your treatment, we will call you to review the results.  Testing/Procedures: Your physician has requested that you have an echocardiogram. Echocardiography is a painless test that uses sound waves to create images of your heart. It provides your doctor with information about the size and shape of your heart and how well your heart's chambers and valves are working. You may receive an ultrasound enhancing agent through an IV if needed to better visualize your heart during the echo.This procedure takes approximately one hour. There are no restrictions for this procedure. This will take place at the [address].   Follow-Up: At [facility], you and your health needs are our priority.  As part of our continuing mission to provide you with exceptional heart care, we have created designated Provider Care Teams.  These Care Teams include your primary Cardiologist (physician) and Advanced Practice Providers (APPs -  Physician Assistants and Nurse Practitioners) who all work together to provide you with the care you need, when you need it.  We recommend signing up for the patient portal called "MyChart".  Sign up information is provided on this After Visit Summary.  MyChart is used to connect with patients for Virtual Visits (Telemedicine).  Patients are able to view lab/test results, encounter notes, upcoming appointments, etc.  Non-urgent messages can be sent to your provider as well.   To learn more about what you can do with  MyChart, go to [email or web address].    Your next appointment:   3 month(s)  The format for your next appointment:   In Person  Provider:   Cherlynn Kaiser, MD  Other Instructions  REFERRAL TO [place]. [place]--- previous Dr.

## 2019-11-02 NOTE — Progress Notes (Addendum)
Cardiology Office Note:    Date:  11/02/2019   ID:  [name], DOB [birth year], MRN [id number]  PCP:  [facility] [place]  Cardiologist:  No primary care provider on file.  Electrophysiologist:  None   Referring MD: Andrew Bond, NP   Chief Complaint/Reason for Referral: HFrEF  History of Present Illness:    Andrew Chan is a 64 y.o. 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  He was most recently followed by Andrew Chan at [facility] for heart failure.  He is transitioning care to [facility] and will be seeking renal transplant evaluation at [facility] upcoming.  Extensive review of prior documentation including [facility]  History provided by the patient as well as collaborative history from his sister who is present for the visit today.  He had an echocardiogram performed in 2018 documenting 20% ejection fraction and class II-III symptoms.  On his last evaluation in April 2020 at [facility], [place] was felt to be 40%.  6-minute walk test documented significant impairment, only walking 800 feet.  ICD implanted for primary prevention, given persistently low EF despite GDMT and PCI to the RCA.  Optivol readings previously document adequate volume status. He describes prior device therapy.  Has history of DVT, provoked initially during ICD implant, wire associated LUE DVT on 01/02/2017. Has been seen by Heme/Onc, who recommend indefinite anticoagulation. ADDENDUM: no longer on anticoagulation.  History of CAD with PCI in 2016 to the RCA, continues on plavix. No ASA given DOAC and plavix.   Feels well overall, NYHA class I-II symptoms. Denies CP or SOB. IHD on MWF.   Has an appointment October 5 with [facility] for renal transplant evaluation.   Much of his HF therapy has been stopped due to hypotension with dialysis.    Past  Medical History:  Diagnosis Date  . Diabetes mellitus without complication ([place])   . Hypertension   . Nerve damage     Past Surgical History:  Procedure Laterality Date  . CARDIAC CATHETERIZATION Left 12/08/2014   Procedure: Left Heart Cath and Coronary Angiography;  Surgeon: Andrew David, MD;  Location: [facility];  Service: Cardiovascular;  Laterality: Left;  . CARDIAC CATHETERIZATION N/A 12/09/2014   Procedure: Coronary Stent Intervention;  Surgeon: Andrew Kida, MD;  Location: [place] [facility];  Service: Cardiovascular;  Laterality: N/A;  . IR DIALY SHUNT INTRO NEEDLE/INTRACATH INITIAL W/IMG RIGHT Right 05/20/2019  . IR RADIOLOGIST EVAL & MGMT  03/30/2019  . none      Current Medications: Current Meds  Medication Sig  . acetaminophen (TYLENOL) 500 MG tablet Take 500 mg by mouth every 6 (six) hours as needed for moderate pain or headache.  Marland Kitchen aspirin EC 81 MG tablet Take 81 mg by mouth daily. Swallow whole.  . insulin glargine (SEMGLEE) 100 UNIT/ML injection Inject 4 Units into the skin at bedtime as needed (for blood sugar over 150).  . sucroferric oxyhydroxide (VELPHORO) 500 MG chewable tablet Chew 500 mg by mouth See admin instructions. Take 500 mg with each meal and 500 mg with each snack     Allergies:   Patient has no known allergies.   Social History   Tobacco Use  . Smoking status: Never Smoker  . Smokeless tobacco: Never Used  Substance Use Topics  . Alcohol use: Yes  . Drug  use: Yes    Types: Cocaine     Family History: The patient's family history includes CAD in his brother and mother; Diabetes Mellitus II in his sister.  ROS:   Please see the history of present illness.    All other systems reviewed and are negative.  EKGs/Labs/Other Studies Reviewed:    The following studies were reviewed today:  EKG:  SR, LAE, inferior infarct  Recent Labs: 05/20/2019: BUN 33; Creatinine, Ser 5.00; Hemoglobin 12.2; Potassium 3.7; Sodium 141    Recent Lipid Panel    Component Value Date/Time   CHOL 198 12/10/2014 0425   TRIG 88 12/10/2014 0425   HDL 60 12/10/2014 0425   CHOLHDL 3.3 12/10/2014 0425   VLDL 18 12/10/2014 0425   LDLCALC 120 (H) 12/10/2014 0425    Physical Exam:    VS:  BP 104/80   Pulse 81   Ht 5\' 8"  (1.727 m)   Wt 162 lb 12.8 oz (73.8 kg)   SpO2 99%   BMI 24.75 kg/m     Wt Readings from Last 5 Encounters:  11/02/19 162 lb 12.8 oz (73.8 kg)  05/20/19 158 lb (71.7 kg)  12/10/14 174 lb 14.4 oz (79.3 kg)  09/06/14 172 lb (78 kg)    Constitutional: No acute distress Eyes: sclera non-icteric, normal conjunctiva and lids ENMT: normal dentition, moist mucous membranes Chest: Left chest wall ICD generator. No pocket erythema, swelling. Incision well healed. Cardiovascular: regular rhythm, normal rate, no murmurs. S1 and S2 normal. No jugular venous distention.  Respiratory: clear to auscultation bilaterally GI : normal bowel sounds, soft and nontender. No distention.   MSK: extremities warm, well perfused. No edema. Right forearm AV fistula with appropriate thrill.  NEURO: grossly nonfocal exam, moves all extremities. PSYCH: alert and oriented x 3, normal mood and affect.   ASSESSMENT:    1. Chronic systolic heart failure ([place])   2. Presence of cardiac defibrillator   3. End stage renal disease ([place])   4. History of DVT (deep vein thrombosis)   5. Current use of long term anticoagulation   6. Coronary artery disease involving native coronary artery of native heart without angina pectoris    PLAN:    Chronic systolic heart failure ([place]) - Plan: EKG 12-Lead, ECHOCARDIOGRAM COMPLETE, Ambulatory referral to Cardiac Electrophysiology Presence of cardiac defibrillator - Plan: Ambulatory referral to Cardiac Electrophysiology  - he is in the process of starting renal transplant evaluation at [facility]. We will obtain echo since last was >1 year ago for current cardiovascular status. Will arrange for EP  device clinic to monitor ICD and Optivol readings.  - will await information required for transplant eval prior to ischemic assessment, since he is asymptomatic.  End stage renal disease ([place]) - pending renal transplant evaluation.  History of DVT (deep vein thrombosis) - per [facility] documentation, he was intended to continue indefinite Eliquis, though this is not on his current medication list. Will need to review chart further to determine current therapy. ADDENDUM: DOAC likely discontinued at time of ESRD diagnosis (I suspect). Unclear why he was not continued on warfarin. PCP inquiring if he should restart - would recommend review with HEM/ONC and review with patient as noted above for any bleeding events that have occurred.  Coronary artery disease involving native coronary artery of native heart without angina pectoris - plavix was on medication list as of 05/29/2018.  - now patient appears to be on ASA 81 mg daily only. With remote PCI this may  be appropriate, however will need to clarify if patient has remained on [facility] and if not, why.   Total time of encounter: 45 minutes total time of encounter, including 30 minutes spent in face-to-face patient care on the date of this encounter. This time includes coordination of care and counseling regarding above mentioned problem list. Remainder of non-face-to-face time involved reviewing chart documents/testing relevant to the patient encounter and documentation in the medical record. I have independently reviewed documentation from referring provider. Extensive independent review of Care Everywhere and [facility] clinic documentation.  Cherlynn Kaiser, MD [place]  [facility]    Medication Adjustments/Labs and Tests Ordered: Current medicines are reviewed at length with the patient today.  Concerns regarding medicines are outlined above.   Orders Placed This Encounter  Procedures  . Ambulatory referral to Cardiac Electrophysiology  . EKG  12-Lead  . ECHOCARDIOGRAM COMPLETE    No orders of the defined types were placed in this encounter.   Patient Instructions  Medication Instructions:  No Changes In Medications at this time.  *If you need a refill on your cardiac medications before your next appointment, please call your pharmacy*  Lab Work: None Ordered At This Time.  If you have labs (blood work) drawn today and your tests are completely normal, you will receive your results only by: Marland Kitchen MyChart Message (if you have MyChart) OR . A paper copy in the mail If you have any lab test that is abnormal or we need to change your treatment, we will call you to review the results.  Testing/Procedures: Your physician has requested that you have an echocardiogram. Echocardiography is a painless test that uses sound waves to create images of your heart. It provides your doctor with information about the size and shape of your heart and how well your heart's chambers and valves are working. You may receive an ultrasound enhancing agent through an IV if needed to better visualize your heart during the echo.This procedure takes approximately one hour. There are no restrictions for this procedure. This will take place at the [address].   Follow-Up: At [facility], you and your health needs are our priority.  As part of our continuing mission to provide you with exceptional heart care, we have created designated Provider Care Teams.  These Care Teams include your primary Cardiologist (physician) and Advanced Practice Providers (APPs -  Physician Assistants and Nurse Practitioners) who all work together to provide you with the care you need, when you need it.  We recommend signing up for the patient portal called "MyChart".  Sign up information is provided on this After Visit Summary.  MyChart is used to connect with patients for Virtual Visits (Telemedicine).  Patients are able to view lab/test results, encounter notes,  upcoming appointments, etc.  Non-urgent messages can be sent to your provider as well.   To learn more about what you can do with MyChart, go to [email or web address].    Your next appointment:   3 month(s)  The format for your next appointment:   In Person  Provider:   Cherlynn Kaiser, MD  Other Instructions  REFERRAL TO [place]. [place]--- previous NP at [facility]. Patient sister requested name.

## 2019-11-04 ENCOUNTER — Ambulatory Visit (HOSPITAL_COMMUNITY)
Admission: RE | Admit: 2019-11-04 | Discharge: 2019-11-04 | Disposition: A | Discharge status: Home / Self Care | Payer: Medicaid Other | Source: Ambulatory Visit | Attending: Internal Medicine | Admitting: Internal Medicine

## 2019-11-04 ENCOUNTER — Other Ambulatory Visit: Payer: Self-pay

## 2019-11-04 DIAGNOSIS — E119 Type 2 diabetes mellitus without complications: Secondary | ICD-10-CM | POA: Diagnosis not present

## 2019-11-04 DIAGNOSIS — I5022 Chronic systolic (congestive) heart failure: Secondary | ICD-10-CM

## 2019-11-04 DIAGNOSIS — I110 Hypertensive heart disease with heart failure: Secondary | ICD-10-CM | POA: Insufficient documentation

## 2019-11-04 LAB — ECHOCARDIOGRAM COMPLETE: S' Lateral: 3.5 cm

## 2019-11-04 NOTE — Progress Notes (Signed)
  Echocardiogram 2D Echocardiogram has been performed.  Andrew Chan 11/04/2019, 3:34 PM

## 2019-11-05 ENCOUNTER — Telehealth: Payer: Self-pay | Admitting: Internal Medicine

## 2019-11-05 DIAGNOSIS — I5022 Chronic systolic (congestive) heart failure: Secondary | ICD-10-CM

## 2019-11-05 DIAGNOSIS — I77819 Aortic ectasia, unspecified site: Secondary | ICD-10-CM

## 2019-11-05 NOTE — Telephone Encounter (Addendum)
Returned call to sister (ok per DPR) aware of results and verbalized understanding.   Order placed for repeat in 1 year   Elouise Munroe, MD  11/04/2019 10:00 PM EDT     No significant change in LV function. Ascending aorta measured in May 2020 elsewhere was similarly dilated, therefore no action needed at this time. Would repeat echo in 1 year for aorta dilation.

## 2019-11-05 NOTE — Telephone Encounter (Signed)
   Andrew Chan is returning Andrew Chan's call she said she is at work and she will just try to call back

## 2019-11-05 NOTE — Telephone Encounter (Signed)
Iris, sister of the patient is returning a call from [place] to discuss Echo results. Please call the sister and not the patient

## 2019-11-05 NOTE — Addendum Note (Signed)
Addended by: Patria Mane A on: 11/05/2019 04:03 PM   Modules accepted: Orders

## 2019-11-10 ENCOUNTER — Telehealth: Payer: Self-pay | Admitting: Internal Medicine

## 2019-11-10 NOTE — Telephone Encounter (Signed)
Andrew Chan with [facility] requesting a call back from Dr. Margaretann Loveless. She states the patient was seen 11/02/2019 and eliquis was mentioned, but the patient is not on the medication. She states per his echo report he needs the script sent in.

## 2019-11-11 ENCOUNTER — Ambulatory Visit: Payer: Medicaid Other | Admitting: Podiatry

## 2019-11-11 NOTE — Telephone Encounter (Signed)
I would be happy to touch base with the caller, I am on vacation, if someone could get me a phone number to call and the team member I need to speak to, I will try Monday.  I have reviewed all the notes from [facility]. It is not clear to me why the patient is not on anticoagulation even though this has been recommended by Heme/Onc in [facility] notes. We would need to know if he was bleeding and if that may be why it stopped, particularly with current dialysis. Since this seems to be for a non-cardiovascular indication, it will need to be clarified with the patient first, and then anticoagulation can prescribed by his primary doctor.

## 2019-11-12 NOTE — Telephone Encounter (Signed)
Attempted to return call to Darlis Loan- left message to return call to office.

## 2019-11-12 NOTE — Telephone Encounter (Signed)
Andrew Chan is returning call. She states if Eliquis is being ordered she would like to have it faxed to [facility] at [phone].

## 2019-11-12 NOTE — Telephone Encounter (Signed)
Spoke with Linda Hedges NP, from [facility] who was wanting to check on the status of the Eliquis order. Dimple Casey of Dr. Delphina Cahill previous message  Elouise Munroe, MD Yesterday (10:58 AM)     I would be happy to touch base with the caller, I am on vacation, if someone could get me a phone number to call and the team member I need to speak to, I will try Monday.  I have reviewed all the notes from [facility]. It is not clear to me why the patient is not on anticoagulation even though this has been recommended by Heme/Onc in [facility] notes. We would need to know if he was bleeding and if that may be why it stopped, particularly with current dialysis. Since this seems to be for a non-cardiovascular indication, it will need to be clarified with the patient first, and then anticoagulation can prescribed by his primary doctor.     Morey Hummingbird stated that the patient has been a patient there for a couple months and Eliquis was never on the patients medication list but due to Echo results she wanted to clarify what she should do with the Eliquis order. She states that she believes there may been a bleeding issue in the past but thinks that the 2.5mg  of Eliquis would be okay. She states that patient will be working towards transplant soon. Dimple Casey that Dr. Margaretann Loveless could give her a call on Monday regarding this, but Morey Hummingbird stated she will not be in the office on Monday or Tuesday. After reading message from Dr. Margaretann Loveless to Morey Hummingbird NP, she stated that she will prescribe the Eliquis to the patient. Morey Hummingbird NP was appreciative of return phone call. Will forward this message to Dr. Margaretann Loveless as an Juluis Rainier.

## 2019-11-15 NOTE — Telephone Encounter (Signed)
Called and left message for Linda Hedges, NP to return call to office.

## 2019-11-17 ENCOUNTER — Ambulatory Visit (INDEPENDENT_AMBULATORY_CARE_PROVIDER_SITE_OTHER): Payer: Medicaid Other | Admitting: Cardiology

## 2019-11-17 ENCOUNTER — Other Ambulatory Visit: Payer: Self-pay

## 2019-11-17 VITALS — BP 106/66 | HR 84 | Ht 68.0 in | Wt 162.0 lb

## 2019-11-17 DIAGNOSIS — I2510 Atherosclerotic heart disease of native coronary artery without angina pectoris: Secondary | ICD-10-CM | POA: Diagnosis not present

## 2019-11-17 DIAGNOSIS — I428 Other cardiomyopathies: Secondary | ICD-10-CM

## 2019-11-17 DIAGNOSIS — Z95810 Presence of automatic (implantable) cardiac defibrillator: Secondary | ICD-10-CM

## 2019-11-17 DIAGNOSIS — N186 End stage renal disease: Secondary | ICD-10-CM

## 2019-11-17 NOTE — Progress Notes (Signed)
Electrophysiology Office Note:    Date:  11/17/2019   ID:  Andrew Chan, DOB 30-Dec-1955, MRN [id number]  PCP:  [facility] [place] Cardiologist:  No primary care provider on file.  [facility] Electrophysiologist:  Andrew Epley, MD   Referring MD: Andrew Munroe, MD   Chief Complaint: Nonischemic cardiomyopathy  History of Present Illness:    Andrew Chan is a 64 y.o. male who presents for an evaluation of nonischemic cardiomyopathy at the request of Dr. Margaretann Chan. Their medical history includes nonischemic cardiomyopathy, DVT, chronic kidney disease undergoing evaluation for renal transplant.  He presents to the clinic to establish care for his ICD given he is transitioning his care to the area from the [facility] system.  He is undergoing a transplant evaluation at [facility].  He has had no therapies from his ICD.  Is well-healed.  Past Medical History:  Diagnosis Date  . Diabetes mellitus without complication ([place])   . Hypertension   . Nerve damage     Past Surgical History:  Procedure Laterality Date  . CARDIAC CATHETERIZATION Left 12/08/2014   Procedure: Left Heart Cath and Coronary Angiography;  Surgeon: Dionisio David, MD;  Location: [place] [facility];  Service: Cardiovascular;  Laterality: Left;  . CARDIAC CATHETERIZATION N/A 12/09/2014   Procedure: Coronary Stent Intervention;  Surgeon: Yolonda Kida, MD;  Location: [facility];  Service: Cardiovascular;  Laterality: N/A;  . IR DIALY SHUNT INTRO NEEDLE/INTRACATH INITIAL W/IMG RIGHT Right 05/20/2019  . IR RADIOLOGIST EVAL & MGMT  03/30/2019  . none      Current Medications: Current Meds  Medication Sig  . acetaminophen (TYLENOL) 500 MG tablet Take 500 mg by mouth every 6 (six) hours as needed for moderate pain or headache.  Marland Kitchen aspirin EC 81 MG tablet Take 81 mg by mouth daily. Swallow whole.  . Cholecalciferol 50 MCG (2000 UT) CAPS Take 2,000 mg by  mouth daily.  . insulin glargine (SEMGLEE) 100 UNIT/ML injection Inject 4 Units into the skin at bedtime as needed (for blood sugar over 150).  . sucroferric oxyhydroxide (VELPHORO) 500 MG chewable tablet Chew 500 mg by mouth See admin instructions. Take 500 mg with each meal and 500 mg with each snack     Allergies:   Patient has no known allergies.   Social History   Socioeconomic History  . Marital status: Single    Spouse name: Not on file  . Number of children: Not on file  . Years of education: Not on file  . Highest education level: Not on file  Occupational History  . Not on file  Tobacco Use  . Smoking status: Never Smoker  . Smokeless tobacco: Never Used  Substance and Sexual Activity  . Alcohol use: Yes  . Drug use: Yes    Types: Cocaine  . Sexual activity: Not on file  Other Topics Concern  . Not on file  Social History Narrative  . Not on file   Social Determinants of Health   Financial Resource Strain:   . Difficulty of Paying Living Expenses: Not on file  Food Insecurity:   . Worried About Charity fundraiser in the Last Year: Not on file  . Ran Out of Food in the Last Year: Not on file  Transportation Needs:   . Lack of Transportation (Medical): Not on file  . Lack of Transportation (Non-Medical): Not on file  Physical Activity:   .  Days of Exercise per Week: Not on file  . Minutes of Exercise per Session: Not on file  Stress:   . Feeling of Stress : Not on file  Social Connections:   . Frequency of Communication with Friends and Family: Not on file  . Frequency of Social Gatherings with Friends and Family: Not on file  . Attends Religious Services: Not on file  . Active Member of Clubs or Organizations: Not on file  . Attends Archivist Meetings: Not on file  . Marital Status: Not on file     Family History: The patient's family history includes CAD in his brother and mother; Diabetes Mellitus II in his sister.  ROS:   Please see  the history of present illness.    All other systems reviewed and are negative.  EKGs/Labs/Other Studies Reviewed:    The following studies were reviewed today: Outside records, and clinic device interrogation  EKG:  The ekg ordered today demonstrates normal sinus rhythm  November 17, 2019 in office device interrogation A sensed V sensed presenting rhythm.   0 treated episodes.   Programmed AAI to DDD 60-1 30 Atrial lead impedance 475 ohms, capture threshold 0.625 V at 0.4 ms, P wave 5.3 mV Ventricular lead impedance 874 ohms, capture threshold 0.5 V at 0.4 ms, R wave 13.1 mV Patient activity 1 hour/day dating back to September 2020 Less than 0.1% V pacing.  2% a paced V sense.  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   Recent Labs: 05/20/2019: BUN 33; Creatinine, Ser 5.00; Hemoglobin 12.2; Potassium 3.7; Sodium 141  Recent Lipid Panel    Component Value Date/Time   CHOL 198 12/10/2014 0425   TRIG 88 12/10/2014 0425   HDL 60 12/10/2014 0425   CHOLHDL 3.3 12/10/2014 0425   VLDL 18 12/10/2014 0425   LDLCALC 120 (H) 12/10/2014 0425    Physical Exam:    VS:  BP 106/66   Pulse 84   Ht _0  (1.727 m)   Wt 162 lb (73.5 kg)   SpO2 99%   BMI 24.63 kg/m     Wt Readings from Last 3 Encounters:  11/17/19 162 lb (73.5 kg)  11/02/19 162 lb 12.8 oz (73.8 kg)  05/20/19 158 lb (71.7 kg)     GEN:  Well nourished, well developed in no acute distress HEENT: Normal NECK: No JVD; No carotid bruits LYMPHATICS: No lymphadenopathy CARDIAC: RRR, no murmurs, rubs, gallops.  ICD pocket well-healed without pain, erythema. RESPIRATORY:  Clear to auscultation without rales, wheezing or rhonchi  ABDOMEN: Soft, non-tender, non-distended MUSCULOSKELETAL:  No edema; No deformity  SKIN: Warm and dry NEUROLOGIC:  Alert and  oriented x 3 PSYCHIATRIC:  Normal affect   ASSESSMENT:    1. ESRD (end stage renal disease) ([place])   2. Nonischemic cardiomyopathy ([place])   3. ICD (implantable cardioverter-defibrillator) in place   4. Coronary artery disease involving native coronary artery of native heart without angina pectoris    PLAN:    In order of problems listed above:  1. Chronic systolic heart failure secondary to nonischemic cardiomyopathy with dual-chamber ICD Has received no therapies from his device.  OptiVol fluid index shows no significant elevations.  We will transition his routine device checks to our clinic and see on an annual basis or sooner as needed.  2.  ESRD on dialysis Hypotension during dialysis sessions are limiting his heart failure therapies.  Currently undergoing work-up for renal transplant  3.  Coronary artery disease Continue aspirin and atorvastatin  Medication Adjustments/Labs and Tests Ordered: Current medicines are reviewed at length with the patient today.  Concerns regarding medicines are outlined above.  Orders Placed This Encounter  Procedures  . EKG 12-Lead   No orders of the defined types were placed in this encounter.    Signed, Lars Mage, MD, [facility]  11/17/2019 8:15 PM    Electrophysiology [facility]

## 2019-11-17 NOTE — Patient Instructions (Addendum)
Medication Instructions:  Your physician recommends that you continue on your current medications as directed. Please refer to the Current Medication list given to you today.  Labwork: None ordered.  Testing/Procedures: None ordered.  Follow-Up: Your physician wants you to follow-up in: one year with Dr. Quentin Ore.   You will receive a reminder letter in the mail two months in advance. If you don't receive a letter, please call our office to schedule the follow-up appointment.  Remote monitoring is used to monitor your ICD from home.   Device clinic [phone]  Any Other Special Instructions Will Be Listed Below (If Applicable).  If you need a refill on your cardiac medications before your next appointment, please call your pharmacy.

## 2019-11-18 ENCOUNTER — Telehealth: Payer: Self-pay

## 2019-11-18 NOTE — Telephone Encounter (Signed)
Requested patient transfer in to [facility] from [facility]. Called and states they will release him.

## 2019-11-19 LAB — CUP PACEART INCLINIC DEVICE CHECK
Battery Remaining Longevity: 99 mo
Battery Voltage: 2.95 V
Brady Statistic AP VP Percent: 0 %
Brady Statistic AP VS Percent: 2.04 %
Brady Statistic AS VP Percent: 0.04 %
Brady Statistic AS VS Percent: 97.91 %
Brady Statistic RA Percent Paced: 2.05 %
Brady Statistic RV Percent Paced: 0.04 %
Date Time Interrogation Session: 20211013145500
HighPow Impedance: 61 Ohm
Implantable Lead Implant Date: 20180918
Implantable Lead Implant Date: 20180918
Implantable Lead Location: 753859
Implantable Lead Location: 753860
Implantable Lead Model: 5076
Implantable Lead Model: 5076
Implantable Pulse Generator Implant Date: 20180918
Lead Channel Impedance Value: 456 Ohm
Lead Channel Impedance Value: 760 Ohm
Lead Channel Impedance Value: 817 Ohm
Lead Channel Pacing Threshold Amplitude: 0.5 V
Lead Channel Pacing Threshold Amplitude: 0.5 V
Lead Channel Pacing Threshold Pulse Width: 0.4 ms
Lead Channel Pacing Threshold Pulse Width: 0.4 ms
Lead Channel Sensing Intrinsic Amplitude: 14.9 mV
Lead Channel Sensing Intrinsic Amplitude: 6.4 mV
Lead Channel Setting Pacing Amplitude: 1.5 V
Lead Channel Setting Pacing Amplitude: 2 V
Lead Channel Setting Pacing Pulse Width: 0.4 ms
Lead Channel Setting Sensing Sensitivity: 0.3 mV

## 2019-11-23 ENCOUNTER — Other Ambulatory Visit: Payer: Self-pay

## 2019-11-23 ENCOUNTER — Encounter: Payer: Self-pay | Admitting: Podiatry

## 2019-11-23 ENCOUNTER — Ambulatory Visit (INDEPENDENT_AMBULATORY_CARE_PROVIDER_SITE_OTHER): Payer: Medicaid Other | Admitting: Podiatry

## 2019-11-23 DIAGNOSIS — M79675 Pain in left toe(s): Secondary | ICD-10-CM

## 2019-11-23 DIAGNOSIS — B351 Tinea unguium: Secondary | ICD-10-CM | POA: Diagnosis not present

## 2019-11-23 DIAGNOSIS — R0989 Other specified symptoms and signs involving the circulatory and respiratory systems: Secondary | ICD-10-CM | POA: Diagnosis not present

## 2019-11-23 DIAGNOSIS — M79674 Pain in right toe(s): Secondary | ICD-10-CM | POA: Diagnosis not present

## 2019-11-23 DIAGNOSIS — E1149 Type 2 diabetes mellitus with other diabetic neurological complication: Secondary | ICD-10-CM

## 2019-11-23 DIAGNOSIS — L84 Corns and callosities: Secondary | ICD-10-CM

## 2019-11-26 NOTE — Progress Notes (Signed)
Subjective:   Patient ID: Andrew Chan, male   DOB: 64 y.o.   MRN: [id number]   HPI 64 year old male presents the office today for concerns of thick calluses on both of his feet as well as for elongated toes that he cannot trim himself.  He is diabetic and last A1c was 11.6.  He denies any open lesions.  He describes numbness to his feet.   Review of Systems  All other systems reviewed and are negative.  Past Medical History:  Diagnosis Date  . Diabetes mellitus without complication ([place])   . Hypertension   . Nerve damage     Past Surgical History:  Procedure Laterality Date  . CARDIAC CATHETERIZATION Left 12/08/2014   Procedure: Left Heart Cath and Coronary Angiography;  Surgeon: Dionisio David, MD;  Location: [facility];  Service: Cardiovascular;  Laterality: Left;  . CARDIAC CATHETERIZATION N/A 12/09/2014   Procedure: Coronary Stent Intervention;  Surgeon: Yolonda Kida, MD;  Location: [facility];  Service: Cardiovascular;  Laterality: N/A;  . IR DIALY SHUNT INTRO NEEDLE/INTRACATH INITIAL W/IMG RIGHT Right 05/20/2019  . IR RADIOLOGIST EVAL & MGMT  03/30/2019  . none       Current Outpatient Medications:  .  acetaminophen (TYLENOL) 500 MG tablet, Take 500 mg by mouth every 6 (six) hours as needed for moderate pain or headache., Disp: , Rfl:  .  aspirin EC 81 MG tablet, Take 81 mg by mouth daily. Swallow whole., Disp: , Rfl:  .  atorvastatin (LIPITOR) 80 MG tablet, Take 80 mg by mouth daily., Disp: , Rfl:  .  Cholecalciferol 50 MCG (2000 UT) CAPS, Take 2,000 mg by mouth daily., Disp: , Rfl:  .  insulin glargine (SEMGLEE) 100 UNIT/ML injection, Inject 4 Units into the skin at bedtime as needed (for blood sugar over 150)., Disp: , Rfl:  .  sucroferric oxyhydroxide (VELPHORO) 500 MG chewable tablet, Chew 500 mg by mouth See admin instructions. Take 500 mg with each meal and 500 mg with each snack, Disp: , Rfl:   No Known Allergies       Objective:   Physical Exam  General: NAD  Dermatological: Nails are hypertrophic, dystrophic, brittle, discolored, elongated 10. No surrounding redness or drainage. Tenderness nails 1-5 bilaterally.  Hyperkeratotic lesion left submetatarsal 1 and right midfoot.  There is no ongoing ulceration drainage or signs of infection.  The lesion on the right foot appears to be more of a skin tag with overlying callus.  There is no hyperpigmented changes noted well-defined circumferential lesion with a callus.  No open lesions or pre-ulcerative lesions are identified today.  Vascular: DP, PT pulses decreased there is no pain with calf compression, erythema or warmth  Neruologic: Sensation decreased with Semmes Weinstein monofilament  Musculoskeletal:  Muscular strength 5/5 in all groups tested bilateral.  Gait: Unassisted, Nonantalgic.       Assessment:   64 year old male with uncontrolled diabetes, symptomatic onychomycosis/preulcerative calluses; PAD     Plan:  -Treatment options discussed including all alternatives, risks, and complications -Etiology of symptoms were discussed -Debrided the nails P53 without complications or bleeding -Debrided calluses x2 without any complications or bleeding -Given decreased pulses as well as pain to his legs will order arterial studies. The numbness is more from neuropathy but given decreased pulses will check arterial studies. -Discussed daily foot inspection  Trula Slade DPM

## 2019-12-16 ENCOUNTER — Other Ambulatory Visit (HOSPITAL_COMMUNITY): Payer: Self-pay | Admitting: Podiatry

## 2019-12-16 ENCOUNTER — Encounter (HOSPITAL_COMMUNITY): Payer: Medicaid Other

## 2019-12-16 ENCOUNTER — Inpatient Hospital Stay (HOSPITAL_COMMUNITY): Admission: RE | Admit: 2019-12-16 | Payer: Medicaid Other | Source: Ambulatory Visit

## 2019-12-16 DIAGNOSIS — I739 Peripheral vascular disease, unspecified: Secondary | ICD-10-CM

## 2019-12-23 ENCOUNTER — Ambulatory Visit (HOSPITAL_COMMUNITY): Payer: Medicaid Other

## 2019-12-23 ENCOUNTER — Other Ambulatory Visit: Payer: Self-pay

## 2019-12-23 ENCOUNTER — Ambulatory Visit (HOSPITAL_COMMUNITY)
Admission: RE | Admit: 2019-12-23 | Discharge: 2019-12-23 | Disposition: A | Discharge status: Home / Self Care | Payer: Medicaid Other | Source: Ambulatory Visit | Attending: Podiatry | Admitting: Podiatry

## 2019-12-23 DIAGNOSIS — I739 Peripheral vascular disease, unspecified: Secondary | ICD-10-CM

## 2019-12-28 DIAGNOSIS — E1142 Type 2 diabetes mellitus with diabetic polyneuropathy: Secondary | ICD-10-CM | POA: Insufficient documentation

## 2019-12-28 DIAGNOSIS — H409 Unspecified glaucoma: Secondary | ICD-10-CM | POA: Insufficient documentation

## 2019-12-28 DIAGNOSIS — Z955 Presence of coronary angioplasty implant and graft: Secondary | ICD-10-CM | POA: Insufficient documentation

## 2019-12-28 DIAGNOSIS — I739 Peripheral vascular disease, unspecified: Secondary | ICD-10-CM | POA: Insufficient documentation

## 2019-12-28 DIAGNOSIS — Z86718 Personal history of other venous thrombosis and embolism: Secondary | ICD-10-CM | POA: Insufficient documentation

## 2019-12-28 DIAGNOSIS — I252 Old myocardial infarction: Secondary | ICD-10-CM | POA: Insufficient documentation

## 2020-01-01 ENCOUNTER — Telehealth: Payer: Self-pay | Admitting: *Deleted

## 2020-01-01 NOTE — Telephone Encounter (Signed)
Called and spoke with the patient's sister Cleda Mccreedy) and relayed the message per Dr Jacqualyn Posey. Lattie Haw

## 2020-01-24 ENCOUNTER — Telehealth: Payer: Self-pay | Admitting: *Deleted

## 2020-01-24 NOTE — Telephone Encounter (Signed)
Please explain to the patient that due to his comorbidities we think it would be best that I meet him in the office first and determine the safest way to do his colonoscopy.  Thank you for this notification

## 2020-01-24 NOTE — Telephone Encounter (Signed)
Called [facility]- LM for Ria Comment to [facility] to make an OV with Dr Carlean Purl per his note   I have canceled the PV and Colon that was scheduled - Lelan Pons PV

## 2020-01-24 NOTE — Telephone Encounter (Signed)
Dr Carlean Purl,  This pt is scheduled to have a direct screening colon with you 02-23-2020 Wednesday.  He has no GI hx- no office visits with you.   He has a hx of pacemaker, chronic systolic heart failure- echo 11-04-19 showed EF 40-45%, no AVS, non ischemic cardiomyopathy, CKD 5, on dialysis- hx hypotension after dialysis, DM- he is being evaluated for kidney transplant at [facility]   Just wanted to make sure he does not need an OV prior to a colon with you.  Please advise  and thanks for your time, Marijean Niemann

## 2020-01-25 NOTE — Telephone Encounter (Signed)
Ov has been scheduled for 2-14 Monday at 150 pm with Dr Carlean Purl- Sch with Ria Comment at [facility] per Epic notes

## 2020-02-08 ENCOUNTER — Ambulatory Visit: Payer: Medicaid Other | Admitting: Internal Medicine

## 2020-02-18 ENCOUNTER — Ambulatory Visit (INDEPENDENT_AMBULATORY_CARE_PROVIDER_SITE_OTHER): Payer: Medicaid Other

## 2020-02-18 DIAGNOSIS — I428 Other cardiomyopathies: Secondary | ICD-10-CM

## 2020-02-18 LAB — CUP PACEART REMOTE DEVICE CHECK
Battery Remaining Longevity: 94 mo
Battery Voltage: 2.96 V
Brady Statistic AP VP Percent: 0 %
Brady Statistic AP VS Percent: 0.04 %
Brady Statistic AS VP Percent: 0.04 %
Brady Statistic AS VS Percent: 99.92 %
Brady Statistic RA Percent Paced: 0.05 %
Brady Statistic RV Percent Paced: 0.04 %
Date Time Interrogation Session: 20220114123629
HighPow Impedance: 57 Ohm
Implantable Lead Implant Date: 20180918
Implantable Lead Implant Date: 20180918
Implantable Lead Location: 753859
Implantable Lead Location: 753860
Implantable Lead Model: 5076
Implantable Lead Model: 5076
Implantable Pulse Generator Implant Date: 20180918
Lead Channel Impedance Value: 532 Ohm
Lead Channel Impedance Value: 779 Ohm
Lead Channel Impedance Value: 874 Ohm
Lead Channel Pacing Threshold Amplitude: 0.375 V
Lead Channel Pacing Threshold Amplitude: 0.75 V
Lead Channel Pacing Threshold Pulse Width: 0.4 ms
Lead Channel Pacing Threshold Pulse Width: 0.4 ms
Lead Channel Sensing Intrinsic Amplitude: 12 mV
Lead Channel Sensing Intrinsic Amplitude: 12 mV
Lead Channel Sensing Intrinsic Amplitude: 4.75 mV
Lead Channel Sensing Intrinsic Amplitude: 4.75 mV
Lead Channel Setting Pacing Amplitude: 1.5 V
Lead Channel Setting Pacing Amplitude: 2 V
Lead Channel Setting Pacing Pulse Width: 0.4 ms
Lead Channel Setting Sensing Sensitivity: 0.3 mV

## 2020-02-23 ENCOUNTER — Encounter: Payer: Medicaid Other | Admitting: Internal Medicine

## 2020-02-29 ENCOUNTER — Encounter: Payer: Self-pay | Admitting: Podiatry

## 2020-02-29 ENCOUNTER — Ambulatory Visit (INDEPENDENT_AMBULATORY_CARE_PROVIDER_SITE_OTHER): Payer: Medicaid Other | Admitting: Podiatry

## 2020-02-29 ENCOUNTER — Other Ambulatory Visit: Payer: Self-pay

## 2020-02-29 DIAGNOSIS — M2041 Other hammer toe(s) (acquired), right foot: Secondary | ICD-10-CM

## 2020-02-29 DIAGNOSIS — E1149 Type 2 diabetes mellitus with other diabetic neurological complication: Secondary | ICD-10-CM | POA: Diagnosis not present

## 2020-02-29 DIAGNOSIS — L84 Corns and callosities: Secondary | ICD-10-CM

## 2020-02-29 DIAGNOSIS — B351 Tinea unguium: Secondary | ICD-10-CM

## 2020-02-29 DIAGNOSIS — M79674 Pain in right toe(s): Secondary | ICD-10-CM | POA: Diagnosis not present

## 2020-02-29 DIAGNOSIS — E119 Type 2 diabetes mellitus without complications: Secondary | ICD-10-CM

## 2020-02-29 DIAGNOSIS — R0989 Other specified symptoms and signs involving the circulatory and respiratory systems: Secondary | ICD-10-CM | POA: Diagnosis not present

## 2020-02-29 DIAGNOSIS — M79675 Pain in left toe(s): Secondary | ICD-10-CM

## 2020-02-29 DIAGNOSIS — M2042 Other hammer toe(s) (acquired), left foot: Secondary | ICD-10-CM

## 2020-03-03 NOTE — Progress Notes (Signed)
Remote ICD transmission.   

## 2020-03-04 NOTE — Progress Notes (Signed)
ANNUAL DIABETIC FOOT EXAM  Subjective: Andrew Chan presents today for for annual diabetic foot examination, at risk foot care. Pt has h/o NIDDM with PAD and painful thick toenails that are difficult to trim. Pain interferes with ambulation. Aggravating factors include wearing enclosed shoe gear. Pain is relieved with periodic professional debridement..  Patient relates 14 year h/o diabetes.  Patient does relate h/o infection to left leg requiring surgical incision and drainage in 2014.  Patient does have symptoms of foot numbness.  Patient admits symptoms of foot tingling.  Patient admits symptoms of burning in feet.  Patient's blood sugar was 235 mg/dl last night.  Patient did not check blood glucose this morning.  Inc, [place] is patient's PCP.   Past Medical History:  Diagnosis Date   Diabetes mellitus without complication ([place])    Hypertension    Nerve damage    Patient Active Problem List   Diagnosis Date Noted   Claudication ([place]) 12/28/2019   Diabetic polyneuropathy associated with type 2 diabetes mellitus ([place]) 12/28/2019   Glaucoma 12/28/2019   History of coronary artery stent placement 12/28/2019   History of DVT (deep vein thrombosis) 12/28/2019   History of non-ST elevation myocardial infarction (NSTEMI) 12/28/2019   PVD (peripheral vascular disease) ([place]) 12/28/2019   Nonischemic cardiomyopathy ([place]) 11/17/2019   ICD (implantable cardioverter-defibrillator) in place 11/17/2019   Allergy, unspecified, initial encounter 10/14/2019   Anaphylactic shock, unspecified, initial encounter 10/14/2019   Underimmunization status 08/05/2019   Encounter for immunization 02/03/2019   Anemia in chronic kidney disease 01/25/2019   Coagulation defect, unspecified ([place]) 01/25/2019   Dependence on renal dialysis ([place]) 01/25/2019   End stage renal disease ([place]) 01/25/2019   Iron deficiency anemia, unspecified 01/25/2019    Mild protein-calorie malnutrition ([place]) 01/25/2019   Pain, unspecified 01/25/2019   Pruritus, unspecified 01/25/2019   Secondary hyperparathyroidism of renal origin ([place]) 01/25/2019   Type 2 diabetes mellitus with diabetic chronic kidney disease ([place]) 01/25/2019   Heart failure, unspecified ([place]) 01/25/2019   History of anemia due to CKD 03/10/2017   Vitamin D deficiency 03/06/2017   Chronic anemia 02/19/2017   Muscular deconditioning 01/23/2017   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 ([place]) 01/03/2017   Wound of left lower extremity 01/03/2017   Cocaine use 12/20/2016   Food insecurity 12/20/2016   Stage 5 chronic kidney disease not on chronic dialysis ([place]) 67/67/2094   Chronic systolic heart failure ([place]) 01/31/2016   Limited literacy 11/17/2015   Alcohol use 11/14/2015   Coronary atherosclerosis of native coronary artery 12/27/2014   Dysthymia 12/27/2014   Chest pain 12/08/2014   Elevated troponin 12/07/2014   Mixed hyperlipidemia 02/20/2014   Orthopnea 02/20/2014   Type 2 diabetes mellitus with diabetic neuropathy ([place]) 02/20/2014   Type 2 diabetes mellitus with other diabetic ophthalmic complication ([place]) 70/96/2836   Hypertension, benign 10/01/2010   Senile nuclear sclerosis 06/22/2010   Retinal edema 06/22/2010   Past Surgical History:  Procedure Laterality Date   CARDIAC CATHETERIZATION Left 12/08/2014   Procedure: Left Heart Cath and Coronary Angiography;  Surgeon: Dionisio David, MD;  Location: [facility];  Service: Cardiovascular;  Laterality: Left;   CARDIAC CATHETERIZATION N/A 12/09/2014   Procedure: Coronary Stent Intervention;  Surgeon: Yolonda Kida, MD;  Location: [facility];  Service: Cardiovascular;  Laterality: N/A;   IR DIALY SHUNT INTRO NEEDLE/INTRACATH INITIAL W/IMG RIGHT Right 05/20/2019   IR RADIOLOGIST EVAL & MGMT  03/30/2019   none     Current Outpatient  Medications on File Prior to Visit  Medication Sig Dispense Refill   iron sucrose in sodium chloride 0.9 % 100 mL Iron Sucrose (Venofer)     Methoxy PEG-Epoetin Beta (MIRCERA IJ) Mircera     acetaminophen (TYLENOL) 500 MG tablet Take 500 mg by mouth every 6 (six) hours as needed for moderate pain or headache.     aspirin EC 81 MG tablet Take 81 mg by mouth daily. Swallow whole.     atorvastatin (LIPITOR) 80 MG tablet Take 80 mg by mouth daily.     AURYXIA 1 GM 210 MG(Fe) tablet Take 210 mg by mouth 3 (three) times daily.     Cholecalciferol 50 MCG (2000 UT) CAPS Take 2,000 mg by mouth daily.     insulin glargine (SEMGLEE) 100 UNIT/ML injection Inject 4 Units into the skin at bedtime as needed (for blood sugar over 150).     sucroferric oxyhydroxide (VELPHORO) 500 MG chewable tablet Chew 500 mg by mouth See admin instructions. Take 500 mg with each meal and 500 mg with each snack     No current facility-administered medications on file prior to visit.    No Known Allergies Social History   Occupational History   Not on file  Tobacco Use   Smoking status: Never Smoker   Smokeless tobacco: Never Used  Substance and Sexual Activity   Alcohol use: Yes   Drug use: Yes    Types: Cocaine   Sexual activity: Not on file   Family History  Problem Relation Age of Onset   CAD Brother    CAD Mother    Diabetes Mellitus II Sister    There is no immunization history for the selected administration types on file for this patient.   Review of Systems: Negative except as noted in the HPI.  Objective: There were no vitals filed for this visit.  Andrew Chan is a pleasant 65 y.o. male in NAD. AAO X 3.  Vascular Examination: Capillary refill time to digits <4 seconds b/l lower extremities. Faintly palpable DP pulse(s) b/l lower extremities. Faintly palpable PT pulse(s) b/l lower extremities. Pedal hair present. Lower extremity skin temperature gradient within normal  limits.  Dermatological Examination: Pedal skin with normal turgor, texture and tone bilaterally. No open wounds bilaterally. No interdigital macerations bilaterally. Toenails 1-5 b/l elongated, discolored, dystrophic, thickened, crumbly with subungual debris and tenderness to dorsal palpation. Hyperkeratotic lesion(s) submet head 1 left foot and plantarlateral arch right foot.  No erythema, no edema, no drainage, no fluctuance.  Musculoskeletal Examination: Normal muscle strength 5/5 to all lower extremity muscle groups bilaterally. No pain crepitus or joint limitation noted with ROM b/l. Hammertoes noted to the 2-5 bilaterally.  Footwear Assessment: Does the patient wear appropriate shoes? No. His shoes are worn. Does the patient need inserts/orthotics? Yes, diabetic inserts are indicated.  Neurological Examination: Pt has subjective symptoms of neuropathy. Protective sensation intact 5/5 intact bilaterally with 10g monofilament b/l. Vibratory sensation intact b/l.   Assessment: 1. Pain due to onychomycosis of toenails of both feet   2. Callus   3. Decreased pedal pulses   4. Acquired hammertoes of both feet   5. Type II diabetes mellitus with neurological manifestations ([place])   6. Encounter for diabetic foot exam ([place])      ADA Risk Categorization: Low Risk :  Patient has all of the following: Intact protective sensation No prior foot ulcer  No severe deformity Pedal  pulses present  Plan: -Examined patient. -Diabetic foot examination performed on today's visit. -Continue diabetic foot care principles. -Patient to continue soft, supportive shoe gear daily. -Toenails 1-5 b/l were debrided in length and girth with sterile nail nippers and dremel without iatrogenic bleeding.  -Callus(es) submet head 1 left foot and plantarlateral arch right foot pared utilizing sterile scalpel blade without complication or incident. Total number debrided =2. -Patient to report any pedal injuries  to medical professional immediately. -Patient/POA to call should there be question/concern in the interim.  Return in about 3 months (around 05/29/2020).  Marzetta Board, DPM

## 2020-03-16 ENCOUNTER — Encounter: Payer: Self-pay | Admitting: Internal Medicine

## 2020-03-16 ENCOUNTER — Other Ambulatory Visit: Payer: Self-pay

## 2020-03-16 ENCOUNTER — Ambulatory Visit (INDEPENDENT_AMBULATORY_CARE_PROVIDER_SITE_OTHER): Payer: Medicaid Other | Admitting: Internal Medicine

## 2020-03-16 VITALS — BP 126/78 | HR 80 | Ht 68.0 in | Wt 166.6 lb

## 2020-03-16 DIAGNOSIS — N186 End stage renal disease: Secondary | ICD-10-CM

## 2020-03-16 DIAGNOSIS — I5022 Chronic systolic (congestive) heart failure: Secondary | ICD-10-CM | POA: Diagnosis not present

## 2020-03-16 DIAGNOSIS — Z95810 Presence of automatic (implantable) cardiac defibrillator: Secondary | ICD-10-CM

## 2020-03-16 DIAGNOSIS — I428 Other cardiomyopathies: Secondary | ICD-10-CM

## 2020-03-16 DIAGNOSIS — I2510 Atherosclerotic heart disease of native coronary artery without angina pectoris: Secondary | ICD-10-CM

## 2020-03-16 DIAGNOSIS — Z86718 Personal history of other venous thrombosis and embolism: Secondary | ICD-10-CM

## 2020-03-16 NOTE — Progress Notes (Signed)
Cardiology Office Note:    Date:  03/16/2020   ID:  Andrew Chan, DOB 1956-01-07, MRN [id number]  PCP:  [facility], [place]  Cardiologist:  No primary care provider on file.  Electrophysiologist:  Vickie Epley, MD   Referring MD: [place]   Chief Complaint/Reason for Referral: HFrEF  History of Present Illness:    Andrew Chan is a 65 y.o. 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  He was most recently followed by Dr. Karle Barr at [facility] for heart failure.  He is transitioning care to [facility] and seeking renal transplant evaluation at [facility].   For a history of DVT, he had been on anticoagulation, which was recommended by hematology, however subsequently took him off blood thinner when he started dialysis. Has history of DVT, provoked initially during ICD implant, wire associated LUE DVT on 01/02/2017. Has been seen by Heme/Onc, who recommend indefinite anticoagulation. No longer on anticoagulation per [facility].  He had an echocardiogram performed in 2018 documenting 20% ejection fraction and class II-III symptoms.  On his last evaluation in April 2020 at [facility] [place] was felt to be 40%.  6-minute walk test documented significant impairment, only walking 800 feet. Echo after our last visit showed no significant change in EF and on chart review no change in ascending aorta dilation.   ICD implanted for primary prevention, given persistently low EF despite GDMT and PCI to the RCA.  Optivol readings previously document adequate volume status. He describes prior device therapy, but no therapies detected on review. Has been seen by my colleague Dr. Quentin Ore    History of CAD with PCI in 2016 to the RCA, now appears that he is only on ASA 81 mg daily.    Feels well overall, NYHA class I-II symptoms. Denies CP or SOB. IHD on MWF.    Much of  his HF therapy has been stopped due to hypotension with dialysis.   Past Medical History:  Diagnosis Date   Anemia in chronic kidney disease    Diabetes mellitus without complication (HCC)    DVT (deep venous thrombosis) (HCC)    left upper extremity   End stage renal disease (HCC)    Glaucoma    Hammertoes of both feet    Heart failure (HCC)    Hypertension    Iron deficiency anemia    Mixed hyperlipidemia    Nerve damage    Nonischemic cardiomyopathy (HCC)    NSTEMI (non-ST elevated myocardial infarction) ([place])    PVD (peripheral vascular disease) ([place])    Retinal edema    Secondary hyperparathyroidism of renal origin ([place])    Senile nuclear sclerosis    Vitamin D deficiency     Past Surgical History:  Procedure Laterality Date   CARDIAC CATHETERIZATION Left 12/08/2014   Procedure: Left Heart Cath and Coronary Angiography;  Surgeon: Dionisio David, MD;  Location: [facility];  Service: Cardiovascular;  Laterality: Left;   CARDIAC CATHETERIZATION N/A 12/09/2014   Procedure: Coronary Stent Intervention;  Surgeon: Yolonda Kida, MD;  Location: [facility];  Service: Cardiovascular;  Laterality: N/A;   IR DIALY SHUNT INTRO NEEDLE/INTRACATH INITIAL W/IMG RIGHT Right 05/20/2019   IR RADIOLOGIST EVAL & MGMT  03/30/2019    Current Medications: Current Meds  Medication Sig   acetaminophen (TYLENOL) 500 MG tablet Take 500 mg by mouth  every 6 (six) hours as needed for moderate pain or headache.   aspirin EC 81 MG tablet Take 81 mg by mouth daily. Swallow whole.   atorvastatin (LIPITOR) 80 MG tablet Take 80 mg by mouth daily.   AURYXIA 1 GM 210 MG(Fe) tablet Take 210 mg by mouth 3 (three) times daily.   Cholecalciferol 50 MCG (2000 UT) CAPS Take 2,000 mg by mouth daily.   insulin glargine (LANTUS) 100 UNIT/ML injection Inject 4 Units into the skin at bedtime as needed (for blood sugar over 150).   iron sucrose in sodium chloride 0.9 % 100 mL Iron Sucrose (Venofer)    Methoxy PEG-Epoetin Beta (MIRCERA IJ) Mircera   sucroferric oxyhydroxide (VELPHORO) 500 MG chewable tablet Chew 500 mg by mouth See admin instructions. Take 500 mg with each meal and 500 mg with each snack     Allergies:   Patient has no known allergies.   Social History   Tobacco Use   Smoking status: Never Smoker   Smokeless tobacco: Never Used  Substance Use Topics   Alcohol use: Yes   Drug use: Yes    Types: Cocaine     Family History: The patient's family history includes CAD in his brother and mother; Diabetes Mellitus II in his sister.  ROS:   Please see the history of present illness.    All other systems reviewed and are negative.  EKGs/Labs/Other Studies Reviewed:    The following studies were reviewed today:  EKG:  n/a  I have independently reviewed the images from: n/a.  Recent Labs: 05/20/2019: BUN 33; Creatinine, Ser 5.00; Hemoglobin 12.2; Potassium 3.7; Sodium 141  Recent Lipid Panel    Component Value Date/Time   CHOL 198 12/10/2014 0425   TRIG 88 12/10/2014 0425   HDL 60 12/10/2014 0425   CHOLHDL 3.3 12/10/2014 0425   VLDL 18 12/10/2014 0425   LDLCALC 120 (H) 12/10/2014 0425    Physical Exam:    VS:  BP 126/78    Pulse 80    Ht 5\' 8"  (1.727 m)    Wt 166 lb 9.6 oz (75.6 kg)    BMI 25.33 kg/m     Wt Readings from Last 5 Encounters:  03/16/20 166 lb 9.6 oz (75.6 kg)  11/17/19 162 lb (73.5 kg)  11/02/19 162 lb 12.8 oz (73.8 kg)  05/20/19 158 lb (71.7 kg)  12/10/14 174 lb 14.4 oz (79.3 kg)    Constitutional: No acute distress Eyes: sclera non-icteric, normal conjunctiva and lids ENMT: normal dentition, moist mucous membranes Cardiovascular: regular rhythm, normal rate, no murmurs. S1 and S2 normal. Radial pulses normal bilaterally. No jugular venous distention.  Respiratory: clear to auscultation bilaterally GI : normal bowel sounds, soft and nontender. No distention.   MSK: extremities warm, well perfused. No edema.  NEURO: grossly  nonfocal exam, moves all extremities. PSYCH: alert and oriented x 3, normal mood and affect.   ASSESSMENT:    No diagnosis found. PLAN:    Chronic systolic heart failure (HCC) -  Presence of cardiac defibrillator -  - not on a medical program for HF due to hypotension.  - undergoing workup for renal transplant. - echo stable with mildly reduced EF. Repeat echo in 1 year for mild ascending aorta dilation and continued transplant workup. - Overall class I-II NYHA symptoms. - follows with EP for ICD.   End stage renal disease ([place]) - pending renal transplant evaluation.   History of DVT (deep vein thrombosis) - per [facility] documentation, he  was intended to continue indefinite Eliquis, though this is not on his current medication list and it appears it was stopped in the setting of dialysis.    Coronary artery disease involving native coronary artery of native heart without angina pectoris - plavix was on medication list as of 05/29/2018.  - now patient appears to be on ASA 81 mg daily only. With remote PCI this is appropriate  Total time of encounter: 30 minutes total time of encounter, including 20 minutes spent in face-to-face patient care on the date of this encounter. This time includes coordination of care and counseling regarding above mentioned problem list. Remainder of non-face-to-face time involved reviewing chart documents/testing relevant to the patient encounter and documentation in the medical record. I have independently reviewed documentation from referring provider.   Cherlynn Kaiser, MD [place]   [facility]    Medication Adjustments/Labs and Tests Ordered: Current medicines are reviewed at length with the patient today.  Concerns regarding medicines are outlined above.   No orders of the defined types were placed in this encounter.   Shared Decision Making/Informed Consent:       No orders of the defined types were placed in this encounter.   Patient  Instructions  Medication Instructions:  Your physician recommends that you continue on your current medications as directed. Please refer to the Current Medication list given to you today.  *If you need a refill on your cardiac medications before your next appointment, please call your pharmacy*   Follow-Up: At [facility], you and your health needs are our priority.  As part of our continuing mission to provide you with exceptional heart care, we have created designated Provider Care Teams.  These Care Teams include your primary Cardiologist (physician) and Advanced Practice Providers (APPs -  Physician Assistants and Nurse Practitioners) who all work together to provide you with the care you need, when you need it.  We recommend signing up for the patient portal called "MyChart".  Sign up information is provided on this After Visit Summary.  MyChart is used to connect with patients for Virtual Visits (Telemedicine).  Patients are able to view lab/test results, encounter notes, upcoming appointments, etc.  Non-urgent messages can be sent to your provider as well.   To learn more about what you can do with MyChart, go to [email or web address].    Your next appointment:   6 month(s)  The format for your next appointment:   In Person  Provider:   Cherlynn Kaiser, MD

## 2020-03-16 NOTE — Patient Instructions (Signed)
Medication Instructions:  Your physician recommends that you continue on your current medications as directed. Please refer to the Current Medication list given to you today.  *If you need a refill on your cardiac medications before your next appointment, please call your pharmacy*   Follow-Up: At [facility], you and your health needs are our priority.  As part of our continuing mission to provide you with exceptional heart care, we have created designated Provider Care Teams.  These Care Teams include your primary Cardiologist (physician) and Advanced Practice Providers (APPs -  Physician Assistants and Nurse Practitioners) who all work together to provide you with the care you need, when you need it.  We recommend signing up for the patient portal called "MyChart".  Sign up information is provided on this After Visit Summary.  MyChart is used to connect with patients for Virtual Visits (Telemedicine).  Patients are able to view lab/test results, encounter notes, upcoming appointments, etc.  Non-urgent messages can be sent to your provider as well.   To learn more about what you can do with MyChart, go to [email or web address].com.    Your next appointment:   6 month(s)  The format for your next appointment:   In Person  Provider:   Gayatri Acharya, MD  

## 2020-03-20 ENCOUNTER — Ambulatory Visit: Payer: Medicaid Other | Admitting: Internal Medicine

## 2020-04-03 ENCOUNTER — Other Ambulatory Visit: Payer: Self-pay | Admitting: Interventional Radiology

## 2020-04-03 ENCOUNTER — Telehealth: Payer: Self-pay | Admitting: Podiatry

## 2020-04-03 DIAGNOSIS — I739 Peripheral vascular disease, unspecified: Secondary | ICD-10-CM

## 2020-04-03 NOTE — Telephone Encounter (Signed)
Spoke to then. I believe they were looking at an order from Dr. Damita Dunnings.

## 2020-04-03 NOTE — Telephone Encounter (Signed)
[facility] calling to get clarity on a specific ultrasound test ordered by [facility]. Please advise.

## 2020-04-25 ENCOUNTER — Other Ambulatory Visit: Payer: Medicaid Other

## 2020-04-25 ENCOUNTER — Inpatient Hospital Stay: Admission: RE | Admit: 2020-04-25 | Payer: Medicaid Other | Source: Ambulatory Visit

## 2020-04-27 ENCOUNTER — Ambulatory Visit
Admission: RE | Admit: 2020-04-27 | Discharge: 2020-04-27 | Disposition: A | Discharge status: Home / Self Care | Payer: No Typology Code available for payment source | Source: Ambulatory Visit | Attending: Interventional Radiology | Admitting: Interventional Radiology

## 2020-04-27 DIAGNOSIS — I739 Peripheral vascular disease, unspecified: Secondary | ICD-10-CM

## 2020-05-03 ENCOUNTER — Other Ambulatory Visit: Payer: Self-pay

## 2020-05-03 ENCOUNTER — Encounter: Payer: Self-pay | Admitting: *Deleted

## 2020-05-03 ENCOUNTER — Ambulatory Visit
Admission: RE | Admit: 2020-05-03 | Discharge: 2020-05-03 | Disposition: A | Discharge status: Home / Self Care | Payer: Medicaid Other | Source: Ambulatory Visit | Attending: Interventional Radiology | Admitting: Interventional Radiology

## 2020-05-03 DIAGNOSIS — I739 Peripheral vascular disease, unspecified: Secondary | ICD-10-CM

## 2020-05-03 HISTORY — PX: IR RADIOLOGIST EVAL & MGMT: IMG5224

## 2020-05-03 NOTE — Progress Notes (Signed)
Chief Complaint: Right leg pain  Referring Physician(s): Upton,Elizabeth  History of Present Illness: Andrew Chan is a 65 y.o. male presenting today as scheduled follow up to [facility], for known PAD.   Andrew Chan joins [place] today by virtual visit given the ongoing COVID situation.  We confirmed his identity with 2 personal identifiers.   Since the last we saw him a year ago, he tells me that he continues to have bilateral, symmetric leg pain from the knees to the ankle.    I cannot elicit any history of claudication.  In fact, he says that his leg pain actually gets a lot better when he is exercising, which includes walking around the neighborhood and using his BowFlex machine.   He denies any lower extremity wound.  I cannot elicit any history of rest pain/night pain.   He continues maximal medical therapy.   CV risk factors:  DM, ESRD, known CAD, HTN.   He denies smoking.    Non invasive exam performed previously: Right TBI: 0.73 Left TBI: 0.89\ The ABI's are not compressible Segmental shows triphasic right AT/DP, with monophasic PT Segmental shows triphasic left AT and PT.    Non-invasive exam 04/27/20 Right TBI: 0.75 Left TBI: 0.73  Segmental exam shows evidence of PT disease bilateral.  Triphasic AT bilateral.   Past Medical History:  Diagnosis Date  . Anemia in chronic kidney disease   . Diabetes mellitus without complication ([place])   . DVT (deep venous thrombosis) ([place])    left upper extremity  . End stage renal disease ([place])   . Glaucoma   . Hammertoes of both feet   . Heart failure ([place])   . Hypertension   . Iron deficiency anemia   . Mixed hyperlipidemia   . Nerve damage   . Nonischemic cardiomyopathy ([place])   . NSTEMI (non-ST elevated myocardial infarction) ([place])   . PVD (peripheral vascular disease) ([place])   . Retinal edema   . Secondary hyperparathyroidism of renal origin ([place])   . Senile nuclear sclerosis   . Vitamin D deficiency      Past Surgical History:  Procedure Laterality Date  . CARDIAC CATHETERIZATION Left 12/08/2014   Procedure: Left Heart Cath and Coronary Angiography;  Surgeon: Dionisio David, MD;  Location: [facility];  Service: Cardiovascular;  Laterality: Left;  . CARDIAC CATHETERIZATION N/A 12/09/2014   Procedure: Coronary Stent Intervention;  Surgeon: Yolonda Kida, MD;  Location: [facility];  Service: Cardiovascular;  Laterality: N/A;  . IR DIALY SHUNT INTRO NEEDLE/INTRACATH INITIAL W/IMG RIGHT Right 05/20/2019  . IR RADIOLOGIST EVAL & MGMT  03/30/2019    Allergies: Patient has no known allergies.  Medications: Prior to Admission medications   Medication Sig Start Date End Date Taking? Authorizing Provider  acetaminophen (TYLENOL) 500 MG tablet Take 500 mg by mouth every 6 (six) hours as needed for moderate pain or headache.    [provider]  aspirin EC 81 MG tablet Take 81 mg by mouth daily. Swallow whole.    [provider]  atorvastatin (LIPITOR) 80 MG tablet Take 80 mg by mouth daily.    [provider]  AURYXIA 1 GM 210 MG(Fe) tablet Take 210 mg by mouth 3 (three) times daily. 02/11/20   [provider]  Cholecalciferol 50 MCG (2000 UT) CAPS Take 2,000 mg by mouth daily.    [provider]  insulin glargine (LANTUS) 100 UNIT/ML injection Inject 4 Units into the skin at  bedtime as needed (for blood sugar over 150).    [provider]  iron sucrose in sodium chloride 0.9 % 100 mL Iron Sucrose (Venofer) 11/24/19 11/15/20  [provider]  Methoxy PEG-Epoetin Beta (MIRCERA IJ) Mircera 02/28/20 02/26/21  [provider]  sucroferric oxyhydroxide (VELPHORO) 500 MG chewable tablet Chew 500 mg by mouth See admin instructions. Take 500 mg with each meal and 500 mg with each snack    [provider]     Family History  Problem Relation Age of Onset  . CAD Brother   . CAD Mother   . Diabetes Mellitus  II Sister     Social History   Socioeconomic History  . Marital status: Single    Spouse name: Not on file  . Number of children: Not on file  . Years of education: Not on file  . Highest education level: Not on file  Occupational History  . Not on file  Tobacco Use  . Smoking status: Never Smoker  . Smokeless tobacco: Never Used  Substance and Sexual Activity  . Alcohol use: Yes  . Drug use: Yes    Types: Cocaine  . Sexual activity: Not on file  Other Topics Concern  . Not on file  Social History Narrative  . Not on file   Social Determinants of Health   Financial Resource Strain: Not on file  Food Insecurity: Not on file  Transportation Needs: Not on file  Physical Activity: Not on file  Stress: Not on file  Social Connections: Not on file       Review of Systems  Review of Systems: A 12 point ROS discussed and pertinent positives are indicated in the HPI above.  All other systems are negative.  Physical Exam No direct physical exam was performed (except for noted visual exam findings with Video Visits).    Vital Signs: There were no vitals taken for this visit.  Imaging: US ARTERIAL SEG MULTIPLE LE (ABI, SEGMENTAL PRESSURES, PVR'S)  Result Date: 04/27/2020 CLINICAL DATA:  65 year old male with a history of peripheral vascular disease EXAM: NONINVASIVE PHYSIOLOGIC VASCULAR STUDY OF BILATERAL LOWER EXTREMITIES TECHNIQUE: Evaluation of both lower extremities was performed at rest, including calculation of ankle-brachial indices, multiple segmental pressure evaluation, segmental Doppler and segmental pulse volume recording. COMPARISON:  None. FINDINGS: Right: Resting ankle brachial index:  Noncompressible Segmental blood pressure: Right upper extremity pressure not measured. Pressures are noncompressible Doppler: Segmental Doppler of the right lower extremity demonstrates triphasic femoral and popliteal arteries. Monophasic posterior tibial artery. Triphasic dorsalis  pedis. Pulse volume recording: Segmental PVR maintained in the right thigh and calf. Deterioration at the ankle and digital segment Left: Resting ankle brachial index: Noncompressible. Segmental blood pressure: Left upper extremity pressure 254 systolic. Pressures are non attainable and noncompressible. Doppler: Segmental Doppler of the left lower extremity demonstrates triphasic femoropopliteal segment. Monophasic posterior tibial artery and triphasic dorsalis pedis. Pulse volume recording: Segmental PVR demonstrates waveforms maintained in the femoral and calf segment with deterioration at the ankle and digital segment. Additional: IMPRESSION: The resting ABI the bilateral lower extremities are non attainable secondary to noncompressible vessels. Segmental exam demonstrates patent femoropopliteal segments, with occlusive disease of the bilateral anterior tibial artery distribution. Signed, Dulcy Fanny. Dellia Nims, RPVI [facility] Radiology Electronically Signed   By: Corrie Mckusick D.O.   On: 04/27/2020 15:26    Labs:  CBC: Recent Labs    05/20/19 1224  HGB 12.2*  HCT 36.0*  COAGS: No results for input(s): INR, APTT in the last 8760 hours.  BMP: Recent Labs    05/20/19 1224  NA 141  K 3.7  CL 94*  GLUCOSE 198*  BUN 33*  CREATININE 5.00*    LIVER FUNCTION TESTS: No results for input(s): BILITOT, AST, ALT, ALKPHOS, PROT, ALBUMIN in the last 8760 hours.  TUMOR MARKERS: No results for input(s): AFPTM, CEA, CA199, CHROMGRNA in the last 8760 hours.  Assessment and Plan:  Assessment:  Andrew Chan is a 65yo male presenting with follow up of known lower extremity PAD.   At this time, he remains asymptomatic, as I believe his pain is most likely not related to arterial disease.   Non-invasive lower extremity exam shows again evidence of good flow in the bilateral fem-pop segments, with PT disease bilateral.     Continued medical  management is for reduction of risk factors is indicated as recommended by updated AHA guidelines1.  This includes anti-platelet medication, tight blood glucose control to a HbA1c < 7, tight blood pressure control, maximum-dose HMG-CoA reductase inhibitor, and smoking cessation.    At this point annual surveillance is indicated, as he remains asymptomatic.  I did let him know that we are happy to see him earlier than a year should he develop symptoms.  He understands.   Plan: - Plan for 1 year clinic follow up with ABI.  - Continued maximal medical therapy for cardiovascular risk reduction, including anti-platelet therapy. -Observe healthy foot care habits, given the presence of diabetes, with at least annual foot inspection performed in the setting of DM.     ___________________________________________________________________   1Morley Kos MD, et al. 2016 AHA/ACC Guideline on the Management of Patients With Lower Extremity Peripheral Artery Disease: Executive Summary: A Report of the American College of Cardiology/American Heart Association Task Force on Clinical Practice Guidelines. J Am Coll Cardiol. 2017 Mar 21;69(11):1465-1508. doi: 10.1016/j.jacc.2016.11.008.   2 - Norgren L, et al. TASC II Working Group. Inter-society consensus for the management of peripheral arterial disease. Int Tressia Miners. 2007 Jun;26(2):81-157. Review. PubMed PMID: 40347425  3 - Hingorani A, et al. The management of diabetic foot: A clinical practice guideline by the Society for Vascular Surgery in collaboration with the [place] and the Society  for Vascular Medicine. J Vasc Surg. 2016 Feb;63(2 Suppl):3S-21S. doi: 10.1016/j.jvs.2015.10.003. PubMed PMID: 95638756.  4 - Corinna Gab, Saab FA, Luberta Mutter, Grant Ruts, Ewell Poe, Driver VR, [place], Lookstein R, van den Baldemar Lenis, Jaff Andrew, Guadalupe Dawn, Henao S, AlMahameed A, Katzen B. Digital Subtraction Angiography Prior to an Amputation for  Critical Limb Ischemia (CLI): An Expert Recommendation Statement From the CLI Global Society to Optimize Limb Salvage. J Endovasc Ther. 2020 Aug;27(4):540-546. doi: 10.1177/1526602820928590. Epub 2020 May 29. PMID: 43329518.   Electronically Signed: Corrie Mckusick 05/03/2020, 12:56 PM   I spent a total of    25 Minutes in remote  clinical consultation, greater than 50% of which was counseling/coordinating care for known PAD, surveillance.    Visit type: Audio only (telephone). Audio (no video) only due to patient's lack of internet/smartphone capability. Alternative for in-person consultation at [facility], [address], [place], [place]. This visit type was conducted due to national recommendations for restrictions regarding the COVID-19 Pandemic (e.g. social distancing).  This format is felt to be most appropriate for this patient at this time.  All issues noted in this document were discussed and addressed.

## 2020-05-09 ENCOUNTER — Ambulatory Visit (INDEPENDENT_AMBULATORY_CARE_PROVIDER_SITE_OTHER): Payer: Medicaid Other | Admitting: Internal Medicine

## 2020-05-09 ENCOUNTER — Encounter: Payer: Self-pay | Admitting: Internal Medicine

## 2020-05-09 ENCOUNTER — Other Ambulatory Visit: Payer: Self-pay

## 2020-05-09 VITALS — BP 121/70 | HR 82 | Ht 68.0 in | Wt 170.0 lb

## 2020-05-09 DIAGNOSIS — I5022 Chronic systolic (congestive) heart failure: Secondary | ICD-10-CM | POA: Diagnosis not present

## 2020-05-09 DIAGNOSIS — N186 End stage renal disease: Secondary | ICD-10-CM | POA: Diagnosis not present

## 2020-05-09 DIAGNOSIS — Z7901 Long term (current) use of anticoagulants: Secondary | ICD-10-CM

## 2020-05-09 DIAGNOSIS — Z95810 Presence of automatic (implantable) cardiac defibrillator: Secondary | ICD-10-CM

## 2020-05-09 DIAGNOSIS — Z992 Dependence on renal dialysis: Secondary | ICD-10-CM

## 2020-05-09 DIAGNOSIS — Z800 Family history of malignant neoplasm of digestive organs: Secondary | ICD-10-CM | POA: Diagnosis not present

## 2020-05-09 NOTE — Progress Notes (Signed)
Andrew Chan 65 y.o. 06/26/1955 [id number]  Assessment & Plan:   Encounter Diagnoses  Name Primary?  . Family history of colon cancer - 2 brothers Yes  . ESRD on dialysis ([place])   . ICD (implantable cardioverter-defibrillator) in place   . Chronic systolic congestive heart failure ([place])   . Long term current use of anticoagulant     Hold off on scheduling colonoscopy until planned cardiac work-up is complete.  I will see him back in June.  I appreciate the opportunity to care for this patient. CC: [employer], [place]    Subjective:   Chief Complaint: Schedule colonoscopy family history of colon cancer  HPI  This is a 65 year old African-American man with a history of heart failure and AICD in place who has had at least 2 previous colonoscopies by his report, no polyps but 2 brothers in the early 23s died from colon cancer.  He denies any active GI symptoms at this time.  I cannot find records of previous colonoscopy in care everywhere but he is certain in his mind that he has had colonoscopy though it has been more than 5 years since he had his last one.  He was seen in the heart failure clinic at [facility] on 03/28/2020 and a left heart catheterization is planned for April 14, he also has chronic anticoagulation therapy for previous venous thromboembolism.  He has end-stage renal disease on hemodialysis and dialyzes Monday Wednesday Friday. No Known Allergies Current Meds  Medication Sig  . acetaminophen (TYLENOL) 500 MG tablet Take 500 mg by mouth every 6 (six) hours as needed for moderate pain or headache.  Marland Kitchen aspirin EC 81 MG tablet Take 81 mg by mouth daily. Swallow whole.  Marland Kitchen atorvastatin (LIPITOR) 80 MG tablet Take 80 mg by mouth daily.  Lorin Picket 1 GM 210 MG(Fe) tablet Take 210 mg by mouth 3 (three) times daily.  . Cholecalciferol 50 MCG (2000 UT) CAPS Take 2,000 mg by mouth daily.  . insulin glargine (LANTUS) 100 UNIT/ML injection Inject 4  Units into the skin at bedtime as needed (for blood sugar over 150).  . iron sucrose in sodium chloride 0.9 % 100 mL Iron Sucrose (Venofer)  . Methoxy PEG-Epoetin Beta (MIRCERA IJ) Mircera  . sucroferric oxyhydroxide (VELPHORO) 500 MG chewable tablet Chew 500 mg by mouth See admin instructions. Take 500 mg with each meal and 500 mg with each snack   Past Medical History:  Diagnosis Date  . Anemia in chronic kidney disease   . Colon cancer ([place])   . Diabetes mellitus without complication ([place])   . DVT (deep venous thrombosis) ([place])    left upper extremity  . End stage renal disease ([place])   . Family history of colon cancer   . Glaucoma   . Hammertoes of both feet   . Heart failure ([place])   . Hypertension   . Iron deficiency anemia   . Mixed hyperlipidemia   . Nerve damage   . Nonischemic cardiomyopathy ([place])   . NSTEMI (non-ST elevated myocardial infarction) ([place])   . PVD (peripheral vascular disease) ([place])   . Retinal edema   . Secondary hyperparathyroidism of renal origin ([place])   . Senile nuclear sclerosis   . Vitamin D deficiency    Past Surgical History:  Procedure Laterality Date  . CARDIAC CATHETERIZATION Left 12/08/2014   Procedure: Left Heart Cath and Coronary Angiography;  Surgeon: Dionisio David, MD;  Location: [facility];  Service: Cardiovascular;  Laterality: Left;  . CARDIAC CATHETERIZATION N/A 12/09/2014   Procedure: Coronary Stent Intervention;  Surgeon: Yolonda Kida, MD;  Location: [facility];  Service: Cardiovascular;  Laterality: N/A;  . IR DIALY SHUNT INTRO NEEDLE/INTRACATH INITIAL W/IMG RIGHT Right 05/20/2019  . IR RADIOLOGIST EVAL & MGMT  03/30/2019  . IR RADIOLOGIST EVAL & MGMT  05/03/2020   Social History   Social History Narrative   He is divorced has 1 son he is disabled   Dialyzes Monday Wednesday Friday   Denies alcohol now but there is a history of that and cocaine use never smoker   family history includes CAD in his brother  and mother; Colon cancer in his brother; Colon polyps in his brother; Diabetes Mellitus II in his sister; Heart disease in his sister.   Review of Systems As per HPI he denies other problems  Objective:   Physical Exam @BP  121/70   Pulse 82   Ht 5\' 8"  (1.727 m)   Wt 170 lb (77.1 kg)   SpO2 100%   BMI 25.85 kg/m @  General:  NAD Eyes:   anicteric Lungs:  Clear AICD in chest wall Heart::  S1S2 no rubs, murmurs or gallops Abdomen:  soft and nontender, BS+     Data Reviewed:  See HPI

## 2020-05-09 NOTE — Patient Instructions (Signed)
It was nice to meet you today.   Please come back to see [place] in June to re-evaluate.   I appreciate the opportunity to care for you. Silvano Rusk, MD, [facility]

## 2020-05-15 ENCOUNTER — Telehealth: Payer: Self-pay | Admitting: Cardiovascular Disease

## 2020-05-15 NOTE — Telephone Encounter (Signed)
Spoke with Nira Conn from [facility] to scheduled new patient appointment---patient scheduled Tuesday 06/27/20 at 3:00pm with Dr. Jamal Collin time is 2:45 pm for check in---Heather has address and phone number on file and she voiced her understanding.

## 2020-05-19 ENCOUNTER — Ambulatory Visit (INDEPENDENT_AMBULATORY_CARE_PROVIDER_SITE_OTHER): Payer: Medicaid Other

## 2020-05-19 DIAGNOSIS — I428 Other cardiomyopathies: Secondary | ICD-10-CM | POA: Diagnosis not present

## 2020-05-23 LAB — CUP PACEART REMOTE DEVICE CHECK
Battery Remaining Longevity: 91 mo
Battery Voltage: 2.95 V
Brady Statistic AP VP Percent: 0 %
Brady Statistic AP VS Percent: 0.01 %
Brady Statistic AS VP Percent: 0.03 %
Brady Statistic AS VS Percent: 99.96 %
Brady Statistic RA Percent Paced: 0.01 %
Brady Statistic RV Percent Paced: 0.03 %
Date Time Interrogation Session: 20220415022824
HighPow Impedance: 53 Ohm
Implantable Lead Implant Date: 20180918
Implantable Lead Implant Date: 20180918
Implantable Lead Location: 753859
Implantable Lead Location: 753860
Implantable Lead Model: 5076
Implantable Lead Model: 5076
Implantable Pulse Generator Implant Date: 20180918
Lead Channel Impedance Value: 475 Ohm
Lead Channel Impedance Value: 817 Ohm
Lead Channel Impedance Value: 874 Ohm
Lead Channel Pacing Threshold Amplitude: 0.375 V
Lead Channel Pacing Threshold Amplitude: 0.875 V
Lead Channel Pacing Threshold Pulse Width: 0.4 ms
Lead Channel Pacing Threshold Pulse Width: 0.4 ms
Lead Channel Sensing Intrinsic Amplitude: 12 mV
Lead Channel Sensing Intrinsic Amplitude: 12 mV
Lead Channel Sensing Intrinsic Amplitude: 5.625 mV
Lead Channel Sensing Intrinsic Amplitude: 5.625 mV
Lead Channel Setting Pacing Amplitude: 1.75 V
Lead Channel Setting Pacing Amplitude: 2 V
Lead Channel Setting Pacing Pulse Width: 0.4 ms
Lead Channel Setting Sensing Sensitivity: 0.3 mV

## 2020-06-06 NOTE — Progress Notes (Signed)
Remote ICD transmission.   

## 2020-06-13 ENCOUNTER — Ambulatory Visit: Payer: Medicaid Other | Admitting: Podiatry

## 2020-06-27 ENCOUNTER — Other Ambulatory Visit: Payer: Self-pay

## 2020-06-27 ENCOUNTER — Ambulatory Visit (INDEPENDENT_AMBULATORY_CARE_PROVIDER_SITE_OTHER): Payer: Medicaid Other | Admitting: Cardiovascular Disease

## 2020-06-27 ENCOUNTER — Encounter: Payer: Self-pay | Admitting: Cardiovascular Disease

## 2020-06-27 VITALS — BP 137/72 | HR 77 | Ht 70.0 in | Wt 178.6 lb

## 2020-06-27 DIAGNOSIS — I5022 Chronic systolic (congestive) heart failure: Secondary | ICD-10-CM

## 2020-06-27 DIAGNOSIS — E1122 Type 2 diabetes mellitus with diabetic chronic kidney disease: Secondary | ICD-10-CM

## 2020-06-27 DIAGNOSIS — Z794 Long term (current) use of insulin: Secondary | ICD-10-CM

## 2020-06-27 DIAGNOSIS — N186 End stage renal disease: Secondary | ICD-10-CM

## 2020-06-27 DIAGNOSIS — Z992 Dependence on renal dialysis: Secondary | ICD-10-CM

## 2020-06-27 DIAGNOSIS — Z95810 Presence of automatic (implantable) cardiac defibrillator: Secondary | ICD-10-CM | POA: Diagnosis not present

## 2020-06-27 DIAGNOSIS — I2510 Atherosclerotic heart disease of native coronary artery without angina pectoris: Secondary | ICD-10-CM

## 2020-06-27 LAB — PACEMAKER DEVICE OBSERVATION

## 2020-06-27 NOTE — Progress Notes (Signed)
Cardiology Office Note:    Date:  07/04/2020   ID:  Andrew Chan, DOB [birth year], MRN [id number]  PCP:  [facility] [facility] Cardiologist:  None Electrophysiologist:  Vickie Epley, MD     Referring MD: [place] West   No chief complaint on file.   History of Present Illness:    Andrew Chan is a 65 y.o. male with a hx of cardiomyopathy (apparently mixed etiology: ischemic and nonischemic; EF at initial presentation in 2000 1420%, global hypokinesis LVEF 40-45% by echo performed September 2021), status post primary prevention ICD (Medtronic Evera MRI implanted 2018), CAD s/p PCI-RCA, end-stage renal disease on hemodialysis, coronary artery disease with previous stent x2, longstanding type 2 diabetes mellitus, history of DVT left upper extremity related to ICD lead, no longer on anticoagulation presenting for evaluation after he told our staff that he was shocked by his defibrillator about 2 weeks ago.  He was at rest minding his own business when all of a sudden he felt a jolt in his chest and assumed that he was shocked by his defibrillator.  He did not have shortness of breath, lingering chest pain, syncope or subsequent palpitations.  He has been going to dialysis Monday Wednesday and Friday and has not had difficulty completing his dialysis sessions, has not had problems or shortness of breath or edema.  Irrigation of his defibrillator today shows normal device function.  He has not had any defibrillator therapies, either antitachycardia pacing or shocks, his device has not detected any ventricular tachycardia, either sustained or nonsustained.  He has not had any atrial fibrillation.  Over the last couple of months, since early April his OptiVol has been very stable.  Activity level is also stable at about 1.1 hours a day.  His current primary cardiologist is Dr. Ainsley Spinner and Dr. Quentin Ore follows his defibrillator.  He was  previously followed by Dr. Karle Barr at [facility] in the heart failure clinic.  Past Medical History:  Diagnosis Date  . Anemia in chronic kidney disease   . Colon cancer ([place])   . Diabetes mellitus without complication ([place])   . DVT (deep venous thrombosis) ([place])    left upper extremity  . End stage renal disease ([place])   . Family history of colon cancer   . Glaucoma   . Hammertoes of both feet   . Heart failure ([place])   . Hypertension   . Iron deficiency anemia   . Mixed hyperlipidemia   . Nerve damage   . Nonischemic cardiomyopathy ([place])   . NSTEMI (non-ST elevated myocardial infarction) ([place])   . PVD (peripheral vascular disease) ([place])   . Retinal edema   . Secondary hyperparathyroidism of renal origin ([place])   . Senile nuclear sclerosis   . Vitamin D deficiency     Past Surgical History:  Procedure Laterality Date  . CARDIAC CATHETERIZATION Left 12/08/2014   Procedure: Left Heart Cath and Coronary Angiography;  Surgeon: Dionisio David, MD;  Location: [facility];  Service: Cardiovascular;  Laterality: Left;  . CARDIAC CATHETERIZATION N/A 12/09/2014   Procedure: Coronary Stent Intervention;  Surgeon: Yolonda Kida, MD;  Location: [facility];  Service: Cardiovascular;  Laterality: N/A;  . COLONOSCOPY    . IR DIALY SHUNT INTRO NEEDLE/INTRACATH INITIAL W/IMG RIGHT Right 05/20/2019  . IR RADIOLOGIST EVAL & MGMT  03/30/2019  . IR RADIOLOGIST EVAL & MGMT  05/03/2020    Current Medications:  Current Meds  Medication Sig  . acetaminophen (TYLENOL) 500 MG tablet Take 500 mg by mouth every 6 (six) hours as needed for moderate pain or headache.  Marland Kitchen aspirin EC 81 MG tablet Take 81 mg by mouth daily. Swallow whole.  Marland Kitchen atorvastatin (LIPITOR) 80 MG tablet Take 80 mg by mouth daily.  Lorin Picket 1 GM 210 MG(Fe) tablet Take 210 mg by mouth 3 (three) times daily.  . Cholecalciferol 50 MCG (2000 UT) CAPS Take 2,000 mg by mouth daily.  . insulin glargine (LANTUS) 100 UNIT/ML injection  Inject 4 Units into the skin at bedtime as needed (for blood sugar over 150).  . iron sucrose in sodium chloride 0.9 % 100 mL Iron Sucrose (Venofer)  . Methoxy PEG-Epoetin Beta (MIRCERA IJ) Mircera  . sucroferric oxyhydroxide (VELPHORO) 500 MG chewable tablet Chew 500 mg by mouth See admin instructions. Take 500 mg with each meal and 500 mg with each snack     Allergies:   Patient has no known allergies.   Social History   Socioeconomic History  . Marital status: Divorced    Spouse name: Not on file  . Number of children: 1  . Years of education: Not on file  . Highest education level: Not on file  Occupational History  . Occupation: disable  Tobacco Use  . Smoking status: Never Smoker  . Smokeless tobacco: Never Used  Vaping Use  . Vaping Use: Never used  Substance and Sexual Activity  . Alcohol use: Yes  . Drug use: Yes    Types: Cocaine  . Sexual activity: Not Currently  Other Topics Concern  . Not on file  Social History Narrative   He is divorced has 1 son he is disabled   Dialyzes Monday Wednesday Friday   Denies alcohol now but there is a history of that and cocaine use never smoker   Social Determinants of Radio broadcast assistant Strain: Not on file  Food Insecurity: Not on file  Transportation Needs: Not on file  Physical Activity: Not on file  Stress: Not on file  Social Connections: Not on file     Family History: The patient's family history includes CAD in his brother and mother; Colon cancer in his brother and brother; Colon polyps in his brother; Diabetes Mellitus II in his sister; Heart disease in his sister.  ROS:   Please see the history of present illness.     All other systems reviewed and are negative.  EKGs/Labs/Other Studies Reviewed:    The following studies were reviewed today: Notes from Dr. Quentin Ore Comprehensive ICD download today  EKG:  EKG is ordered today.  The ekg ordered today demonstrates NSR w 1st deg AV block  Recent  Labs: No results found for requested labs within last 8760 hours.  Recent Lipid Panel    Component Value Date/Time   CHOL 198 12/10/2014 0425   TRIG 88 12/10/2014 0425   HDL 60 12/10/2014 0425   CHOLHDL 3.3 12/10/2014 0425   VLDL 18 12/10/2014 0425   LDLCALC 120 (H) 12/10/2014 0425     Risk Assessment/Calculations:       Physical Exam:    VS:  BP 137/72   Pulse 77   Ht 5\' 10"  (1.778 m)   Wt 178 lb 9.6 oz (81 kg)   SpO2 97%   BMI 25.63 kg/m     Wt Readings from Last 3 Encounters:  06/27/20 178 lb 9.6 oz (81 kg)  05/09/20 170 lb (77.1  kg)  03/16/20 166 lb 9.6 oz (75.6 kg)     GEN:  Well nourished, well developed in no acute distress HEENT: Normal NECK: 8 cm JVD; No carotid bruits LYMPHATICS: No lymphadenopathy CARDIAC: RRR, no murmurs, rubs, gallops. R radial AV fistula w excellent bruit/thrill RESPIRATORY:  Clear to auscultation without rales, wheezing or rhonchi  ABDOMEN: Soft, non-tender, non-distended MUSCULOSKELETAL:  No edema; No deformity  SKIN: Warm and dry NEUROLOGIC:  Alert and oriented x 3 PSYCHIATRIC:  Normal affect   ASSESSMENT:    1. ICD (implantable cardioverter-defibrillator) in place   2. Chronic systolic heart failure ([place])   3. Coronary artery disease involving native coronary artery of native heart without angina pectoris   4. Type 2 diabetes mellitus with chronic kidney disease on chronic dialysis, with long-term current use of insulin (HCC)    PLAN:    In order of problems listed above:  1. ICD "shock": Confirmed by in person device check.  Not sure what caused the jolt in his chest but he has not had even nonsustained VT, let alone therapies for tachyarrhythmia. 2. CHF: Has moderate jugular venous distention but no other signs of hypervolemia and is due for hemodialysis tomorrow morning.  Denies dyspnea.  Most of his heart failure medications had to be discontinued due to recurrent problems with hypotension 3. CAD: Does not have angina  pectoris.  On statin.  Do not have a recent lipid profile.  DL less than 70 4. DM: Complicated by ESRD.  On insulin. 5. ESRD: HD MWF.        Medication Adjustments/Labs and Tests Ordered: Current medicines are reviewed at length with the patient today.  Concerns regarding medicines are outlined above.  Orders Placed This Encounter  Procedures  . EKG 12-Lead   No orders of the defined types were placed in this encounter.   Patient Instructions  Medication Instructions:  No changes *If you need a refill on your cardiac medications before your next appointment, please call your pharmacy*   Lab Work: None ordered If you have labs (blood work) drawn today and your tests are completely normal, you will receive your results only by: Marland Kitchen MyChart Message (if you have MyChart) OR . A paper copy in the mail If you have any lab test that is abnormal or we need to change your treatment, we will call you to review the results.   Testing/Procedures: None ordered   Follow-Up: At [facility], you and your health needs are our priority.  As part of our continuing mission to provide you with exceptional heart care, we have created designated Provider Care Teams.  These Care Teams include your primary Cardiologist (physician) and Advanced Practice Providers (APPs -  Physician Assistants and Nurse Practitioners) who all work together to provide you with the care you need, when you need it.  We recommend signing up for the patient portal called "MyChart".  Sign up information is provided on this After Visit Summary.  MyChart is used to connect with patients for Virtual Visits (Telemedicine).  Patients are able to view lab/test results, encounter notes, upcoming appointments, etc.  Non-urgent messages can be sent to your provider as well.   To learn more about what you can do with MyChart, go to [email or web address].    Your next appointment:   Keep follow up with Dr. Margaretann Loveless      Signed, Sanda Klein, MD  07/04/2020 6:07 PM    [place]

## 2020-06-27 NOTE — Patient Instructions (Signed)
Medication Instructions:  No changes *If you need a refill on your cardiac medications before your next appointment, please call your pharmacy*   Lab Work: None ordered If you have labs (blood work) drawn today and your tests are completely normal, you will receive your results only by: Marland Kitchen MyChart Message (if you have MyChart) OR . A paper copy in the mail If you have any lab test that is abnormal or we need to change your treatment, we will call you to review the results.   Testing/Procedures: None ordered   Follow-Up: At [facility], you and your health needs are our priority.  As part of our continuing mission to provide you with exceptional heart care, we have created designated Provider Care Teams.  These Care Teams include your primary Cardiologist (physician) and Advanced Practice Providers (APPs -  Physician Assistants and Nurse Practitioners) who all work together to provide you with the care you need, when you need it.  We recommend signing up for the patient portal called "MyChart".  Sign up information is provided on this After Visit Summary.  MyChart is used to connect with patients for Virtual Visits (Telemedicine).  Patients are able to view lab/test results, encounter notes, upcoming appointments, etc.  Non-urgent messages can be sent to your provider as well.   To learn more about what you can do with MyChart, go to [email or web address].    Your next appointment:   Keep follow up with Dr. Margaretann Loveless

## 2020-06-29 ENCOUNTER — Ambulatory Visit: Payer: Medicaid Other | Admitting: Internal Medicine

## 2020-08-01 ENCOUNTER — Ambulatory Visit: Payer: Medicaid Other | Admitting: Internal Medicine

## 2020-08-18 ENCOUNTER — Ambulatory Visit (INDEPENDENT_AMBULATORY_CARE_PROVIDER_SITE_OTHER): Payer: Medicaid Other

## 2020-08-18 DIAGNOSIS — I428 Other cardiomyopathies: Secondary | ICD-10-CM

## 2020-08-21 LAB — CUP PACEART REMOTE DEVICE CHECK
Battery Remaining Longevity: 88 mo
Battery Voltage: 2.94 V
Brady Statistic AP VP Percent: 0 %
Brady Statistic AP VS Percent: 0.02 %
Brady Statistic AS VP Percent: 0.03 %
Brady Statistic AS VS Percent: 99.95 %
Brady Statistic RA Percent Paced: 0.02 %
Brady Statistic RV Percent Paced: 0.03 %
Date Time Interrogation Session: 20220715001807
HighPow Impedance: 55 Ohm
Implantable Lead Implant Date: 20180918
Implantable Lead Implant Date: 20180918
Implantable Lead Location: 753859
Implantable Lead Location: 753860
Implantable Lead Model: 5076
Implantable Lead Model: 5076
Implantable Pulse Generator Implant Date: 20180918
Lead Channel Impedance Value: 418 Ohm
Lead Channel Impedance Value: 722 Ohm
Lead Channel Impedance Value: 779 Ohm
Lead Channel Pacing Threshold Amplitude: 0.375 V
Lead Channel Pacing Threshold Amplitude: 0.625 V
Lead Channel Pacing Threshold Pulse Width: 0.4 ms
Lead Channel Pacing Threshold Pulse Width: 0.4 ms
Lead Channel Sensing Intrinsic Amplitude: 10.75 mV
Lead Channel Sensing Intrinsic Amplitude: 10.75 mV
Lead Channel Sensing Intrinsic Amplitude: 5.75 mV
Lead Channel Sensing Intrinsic Amplitude: 5.75 mV
Lead Channel Setting Pacing Amplitude: 1.5 V
Lead Channel Setting Pacing Amplitude: 2 V
Lead Channel Setting Pacing Pulse Width: 0.4 ms
Lead Channel Setting Sensing Sensitivity: 0.3 mV

## 2020-09-11 NOTE — Progress Notes (Signed)
Remote ICD transmission.   

## 2020-09-19 ENCOUNTER — Ambulatory Visit (INDEPENDENT_AMBULATORY_CARE_PROVIDER_SITE_OTHER): Payer: Medicaid Other | Admitting: Internal Medicine

## 2020-09-19 ENCOUNTER — Encounter: Payer: Self-pay | Admitting: Internal Medicine

## 2020-09-19 VITALS — BP 126/78 | HR 80 | Ht 67.75 in | Wt 179.0 lb

## 2020-09-19 DIAGNOSIS — Z800 Family history of malignant neoplasm of digestive organs: Secondary | ICD-10-CM | POA: Diagnosis not present

## 2020-09-19 DIAGNOSIS — Z550 Illiteracy and low-level literacy: Secondary | ICD-10-CM

## 2020-09-19 DIAGNOSIS — Z992 Dependence on renal dialysis: Secondary | ICD-10-CM

## 2020-09-19 DIAGNOSIS — Z95810 Presence of automatic (implantable) cardiac defibrillator: Secondary | ICD-10-CM | POA: Diagnosis not present

## 2020-09-19 DIAGNOSIS — N186 End stage renal disease: Secondary | ICD-10-CM | POA: Diagnosis not present

## 2020-09-19 DIAGNOSIS — I5022 Chronic systolic (congestive) heart failure: Secondary | ICD-10-CM

## 2020-09-19 NOTE — Progress Notes (Signed)
Andrew Chan 65 y.o. 06/10/1955 [id number]  Assessment & Plan:   Encounter Diagnoses  Name Primary?   Family history of colon cancer - 2 brothers Yes   ESRD on dialysis [facility])    ICD (implantable cardioverter-defibrillator) in place    Chronic systolic congestive heart failure ([place])    History of cocaine use    Limited literacy     I cannot schedule him for an elective colonoscopy for screening at this point until I understand better what the plans are for cardiac issues.  It does not sound like there is anything serious going on at this time but before I schedule I want to understand if there are plans to proceed with a heart catheterization.  I suspect that that was a standard rule out ischemia for heart failure procedure that was planned but until he has had that done or it is decided not to pursue that I am not going to schedule him for an elective colonoscopy.  I will see if I can sort out what is going on with the cardiology work-up in whom and where it is to be done.  In the meantime I will place a recall for January 63 to revisit things.  I do think that his sophistication and literacy level is likely playing a role here.  We will try to sort through that and help.  CC: [facility] I am having him take his AVS to [facility] when he goes there on Thursday as I do not know a specific position or provider that is caring for him.  We will try to contact them through other means as well   I will send a letter to the cardiologist and renal transplant specialist at [facility] regarding plans for follow-up there.  If if he is not going to have any further cardiac evaluation I think I can schedule a procedure at the hospital given his low EF and AICD he would need to be done there.  Subjective:   Chief Complaint: Family history of colon cancer, screening colonoscopy needed  HPI Patient is a 65 year old African-American man seen in April to schedule a  screening colonoscopy having had 2 brothers with colon cancer.  At that time chart review indicated he was to have a cardiac catheterization as part of a heart failure work-up prior to potential kidney transplant.  This was supposed to have been done at [facility].  It has not been done.  I do see a note from May 2022 by Dr. Sallyanne Kuster of [place] cardiology who saw the patient because of an incidental discharge of his AICD.  There was no suspicion of angina, he had no evidence of dysrhythmia or reason for the AICD to discharge and he was sent home.  He said that his cardiac catheterization was canceled because he had dialysis.  He is not having any GI symptoms at this time.  He had been on Eliquis for a DVT and that has been discontinued. Allergies  Allergen Reactions   Advil [Ibuprofen]     Stomach problems   Current Meds  Medication Sig   acetaminophen (TYLENOL) 500 MG tablet Take 500 mg by mouth every 6 (six) hours as needed for moderate pain or headache.   aspirin EC 81 MG tablet Take 81 mg by mouth daily. Swallow whole.   atorvastatin (LIPITOR) 80 MG tablet Take 80 mg by mouth daily.   AURYXIA 1 GM 210 MG(Fe) tablet Take 210 mg  by mouth 3 (three) times daily.   Cholecalciferol 50 MCG (2000 UT) CAPS Take 2,000 mg by mouth daily.   insulin glargine (LANTUS) 100 UNIT/ML injection Inject 4 Units into the skin at bedtime as needed (for blood sugar over 150).   iron sucrose in sodium chloride 0.9 % 100 mL Iron Sucrose (Venofer)   Methoxy PEG-Epoetin Beta (MIRCERA IJ) Mircera   sucroferric oxyhydroxide (VELPHORO) 500 MG chewable tablet Chew 500 mg by mouth See admin instructions. Take 500 mg with each meal and 500 mg with each snack   Past Medical History:  Diagnosis Date   Anemia in chronic kidney disease    Colon cancer ([place])    Diabetes mellitus without complication ([place])    DVT (deep venous thrombosis) (HCC)    left upper extremity   End stage renal disease ([place])    Family history of  colon cancer    Glaucoma    Hammertoes of both feet    Heart failure (HCC)    Hypertension    Iron deficiency anemia    Mixed hyperlipidemia    Nerve damage    Nonischemic cardiomyopathy (HCC)    NSTEMI (non-ST elevated myocardial infarction) ([place])    PVD (peripheral vascular disease) ([place])    Retinal edema    Secondary hyperparathyroidism of renal origin ([place])    Senile nuclear sclerosis    Vitamin D deficiency    Past Surgical History:  Procedure Laterality Date   CARDIAC CATHETERIZATION Left 12/08/2014   Procedure: Left Heart Cath and Coronary Angiography;  Surgeon: Dionisio David, MD;  Location: [facility];  Service: Cardiovascular;  Laterality: Left;   CARDIAC CATHETERIZATION N/A 12/09/2014   Procedure: Coronary Stent Intervention;  Surgeon: Yolonda Kida, MD;  Location: [facility];  Service: Cardiovascular;  Laterality: N/A;   COLONOSCOPY     IR DIALY SHUNT INTRO NEEDLE/INTRACATH INITIAL W/IMG RIGHT Right 05/20/2019   IR RADIOLOGIST EVAL & MGMT  03/30/2019   IR RADIOLOGIST EVAL & MGMT  05/03/2020   Social History   Social History Narrative   He is divorced has 1 son he is disabled   Dialyzes Monday Wednesday Friday   Denies alcohol now but there is a history of that and cocaine use never smoker   family history includes CAD in his brother and mother; Colon cancer in his brother, brother, and sister; Colon polyps in his brother; Diabetes Mellitus II in his sister; Heart disease in his sister; Heart failure in his mother; Kidney failure in his father; Other in his daughter; Seizures in his son.   Review of Systems As above  Objective:   Physical Exam BP 126/78 (BP Location: Left Arm, Patient Position: Sitting, Cuff Size: Normal)   Pulse 80   Ht 5' 7.75" (1.721 m) Comment: height measured without shoes  Wt 179 lb (81.2 kg)   BMI 27.42 kg/m

## 2020-09-19 NOTE — Patient Instructions (Signed)
I cannot schedule your colonoscopy yet because of your cardiac status.  When I saw you in April there were plans for you to have a heart catheterization at [facility] also have a cardiologist here in [place].  You have not had that heart catheterization done.  We cannot schedule the colonoscopy until your heart issues are cleared up.  I know you feel well but they were planning on investigating your heart more closely given the possibility of a kidney transplant.  I am going to check with the heart doctors here in [place] but you may need to go back to the [facility] heart specialists as well.  It is confusing to me so I suggest you show this to your [facility] doctor when you go there on Thursday so they understand.  I will put a reminder in our system to check up on you later this year or at the beginning of next year.  Gatha Mayer, MD, [facility]

## 2020-09-28 ENCOUNTER — Ambulatory Visit
Admission: RE | Admit: 2020-09-28 | Discharge: 2020-09-28 | Disposition: A | Discharge status: Home / Self Care | Payer: Medicaid Other | Source: Ambulatory Visit | Attending: Family Medicine | Admitting: Family Medicine

## 2020-09-28 ENCOUNTER — Other Ambulatory Visit: Payer: Self-pay | Admitting: Family Medicine

## 2020-09-28 DIAGNOSIS — R609 Edema, unspecified: Secondary | ICD-10-CM

## 2020-09-28 DIAGNOSIS — S97129A Crushing injury of unspecified lesser toe(s), initial encounter: Secondary | ICD-10-CM

## 2020-11-02 ENCOUNTER — Other Ambulatory Visit: Payer: Self-pay

## 2020-11-02 ENCOUNTER — Ambulatory Visit (HOSPITAL_COMMUNITY): Payer: Medicaid Other | Attending: Internal Medicine

## 2020-11-02 DIAGNOSIS — I5022 Chronic systolic (congestive) heart failure: Secondary | ICD-10-CM

## 2020-11-02 DIAGNOSIS — I77819 Aortic ectasia, unspecified site: Secondary | ICD-10-CM

## 2020-11-02 LAB — ECHOCARDIOGRAM COMPLETE
Area-P 1/2: 4.06 cm2
S' Lateral: 3.6 cm

## 2020-11-02 NOTE — Progress Notes (Unsigned)
The patient declined the administration of Definity at this time.

## 2020-11-17 ENCOUNTER — Ambulatory Visit (INDEPENDENT_AMBULATORY_CARE_PROVIDER_SITE_OTHER): Payer: Medicaid Other

## 2020-11-17 DIAGNOSIS — I428 Other cardiomyopathies: Secondary | ICD-10-CM

## 2020-11-20 LAB — CUP PACEART REMOTE DEVICE CHECK
Battery Remaining Longevity: 82 mo
Battery Voltage: 2.94 V
Brady Statistic AP VP Percent: 0 %
Brady Statistic AP VS Percent: 0.07 %
Brady Statistic AS VP Percent: 0.03 %
Brady Statistic AS VS Percent: 99.89 %
Brady Statistic RA Percent Paced: 0.08 %
Brady Statistic RV Percent Paced: 0.03 %
Date Time Interrogation Session: 20221014134226
HighPow Impedance: 64 Ohm
Implantable Lead Implant Date: 20180918
Implantable Lead Implant Date: 20180918
Implantable Lead Location: 753859
Implantable Lead Location: 753860
Implantable Lead Model: 5076
Implantable Lead Model: 5076
Implantable Pulse Generator Implant Date: 20180918
Lead Channel Impedance Value: 456 Ohm
Lead Channel Impedance Value: 760 Ohm
Lead Channel Impedance Value: 836 Ohm
Lead Channel Pacing Threshold Amplitude: 0.375 V
Lead Channel Pacing Threshold Amplitude: 0.625 V
Lead Channel Pacing Threshold Pulse Width: 0.4 ms
Lead Channel Pacing Threshold Pulse Width: 0.4 ms
Lead Channel Sensing Intrinsic Amplitude: 10.5 mV
Lead Channel Sensing Intrinsic Amplitude: 10.5 mV
Lead Channel Sensing Intrinsic Amplitude: 5 mV
Lead Channel Sensing Intrinsic Amplitude: 5 mV
Lead Channel Setting Pacing Amplitude: 1.5 V
Lead Channel Setting Pacing Amplitude: 2 V
Lead Channel Setting Pacing Pulse Width: 0.4 ms
Lead Channel Setting Sensing Sensitivity: 0.3 mV

## 2020-11-27 NOTE — Progress Notes (Signed)
Remote ICD transmission.   

## 2020-12-22 NOTE — Progress Notes (Shared)
[facility] Note  01/02/2021     CHIEF COMPLAINT Patient presents for No chief complaint on file.   HISTORY OF PRESENT ILLNESS: Andrew Chan is a 65 y.o. male who presents to the clinic today for:     Referring physician: Hastings Casselman,  [place] [id number]  HISTORICAL INFORMATION:   Selected notes from the [facility] Referral for eval of DR   CURRENT MEDICATIONS: No current outpatient medications on file. (Ophthalmic Drugs)   No current facility-administered medications for this visit. (Ophthalmic Drugs)   Current Outpatient Medications (Other)  Medication Sig   acetaminophen (TYLENOL) 500 MG tablet Take 500 mg by mouth every 6 (six) hours as needed for moderate pain or headache.   aspirin EC 81 MG tablet Take 81 mg by mouth daily. Swallow whole.   atorvastatin (LIPITOR) 80 MG tablet Take 80 mg by mouth daily.   AURYXIA 1 GM 210 MG(Fe) tablet Take 210 mg by mouth 3 (three) times daily.   Cholecalciferol 50 MCG (2000 UT) CAPS Take 2,000 mg by mouth daily.   insulin glargine (LANTUS) 100 UNIT/ML injection Inject 4 Units into the skin at bedtime as needed (for blood sugar over 150).   Methoxy PEG-Epoetin Beta (MIRCERA IJ) Mircera   sucroferric oxyhydroxide (VELPHORO) 500 MG chewable tablet Chew 500 mg by mouth See admin instructions. Take 500 mg with each meal and 500 mg with each snack   No current facility-administered medications for this visit. (Other)      REVIEW OF SYSTEMS:    ALLERGIES Allergies  Allergen Reactions   Advil [Ibuprofen]     Stomach problems    PAST MEDICAL HISTORY Past Medical History:  Diagnosis Date   Anemia in chronic kidney disease    Colon cancer ([place])    Diabetes mellitus without complication ([place])    DVT (deep venous thrombosis) (HCC)    left upper extremity   End stage renal disease ([place])    Family history of colon cancer    Glaucoma     Hammertoes of both feet    Heart failure ([place])    Hypertension    Iron deficiency anemia    Mixed hyperlipidemia    Nerve damage    Nonischemic cardiomyopathy ([place])    NSTEMI (non-ST elevated myocardial infarction) ([place])    PVD (peripheral vascular disease) ([place])    Retinal edema    Secondary hyperparathyroidism of renal origin ([place])    Senile nuclear sclerosis    Vitamin D deficiency    Past Surgical History:  Procedure Laterality Date   CARDIAC CATHETERIZATION Left 12/08/2014   Procedure: Left Heart Cath and Coronary Angiography;  Surgeon: Dionisio David, MD;  Location: [facility];  Service: Cardiovascular;  Laterality: Left;   CARDIAC CATHETERIZATION N/A 12/09/2014   Procedure: Coronary Stent Intervention;  Surgeon: Yolonda Kida, MD;  Location: [facility];  Service: Cardiovascular;  Laterality: N/A;   COLONOSCOPY     IR DIALY SHUNT INTRO NEEDLE/INTRACATH INITIAL W/IMG RIGHT Right 05/20/2019   IR RADIOLOGIST EVAL & MGMT  03/30/2019   IR RADIOLOGIST EVAL & MGMT  05/03/2020    FAMILY HISTORY Family History  Problem Relation Age of Onset   CAD Mother    Heart failure Mother    Kidney failure Father    Diabetes Mellitus II Sister    Heart disease Sister    Colon cancer Sister  CAD Brother    Colon cancer Brother    Colon polyps Brother    Colon cancer Brother    Seizures Son    Other Daughter        died at birth    SOCIAL HISTORY Social History   Tobacco Use   Smoking status: Never   Smokeless tobacco: Never  Vaping Use   Vaping Use: Never used  Substance Use Topics   Alcohol use: Yes   Drug use: Yes    Types: Cocaine         OPHTHALMIC EXAM:  Not recorded     IMAGING AND PROCEDURES  Imaging and Procedures for 01/02/2021           ASSESSMENT/PLAN:    ICD-10-CM   1. Retinal edema  H35.81       1.  2.  3.  Ophthalmic Meds Ordered this visit:  No orders of the defined types were placed in this  encounter.      No follow-ups on file.  There are no Patient Instructions on file for this visit.   Explained the diagnoses, plan, and follow up with the patient and they expressed understanding.  Patient expressed understanding of the importance of proper follow up care.   This document serves as a record of services personally performed by Gardiner Sleeper, MD, PhD. It was created on their behalf by Estill Bakes, COT an ophthalmic technician. The creation of this record is the provider's dictation and/or activities during the visit.    Electronically signed by: Estill Bakes, COT 11.18.22 @ 10:50 AM   Gardiner Sleeper, M.D., Ph.D. Diseases & Surgery of the Retina and Vitreous [facility]: M myopia (nearsighted); A astigmatism; H hyperopia (farsighted); P presbyopia; Mrx spectacle prescription;  CTL contact lenses; OD right eye; OS left eye; OU both eyes  XT exotropia; ET esotropia; PEK punctate epithelial keratitis; PEE punctate epithelial erosions; DES dry eye syndrome; MGD meibomian gland dysfunction; ATs artificial tears; PFAT's preservative free artificial tears; [place] nuclear sclerotic cataract; PSC posterior subcapsular cataract; ERM epi-retinal membrane; PVD posterior vitreous detachment; RD retinal detachment; DM diabetes mellitus; DR diabetic retinopathy; NPDR non-proliferative diabetic retinopathy; PDR proliferative diabetic retinopathy; CSME clinically significant macular edema; DME diabetic macular edema; dbh dot blot hemorrhages; CWS cotton wool spot; POAG primary open angle glaucoma; C/D cup-to-disc ratio; HVF humphrey visual field; GVF goldmann visual field; OCT optical coherence tomography; IOP intraocular pressure; BRVO Branch retinal vein occlusion; CRVO central retinal vein occlusion; CRAO central retinal artery occlusion; BRAO branch retinal artery occlusion; RT retinal tear; SB scleral buckle; PPV pars plana vitrectomy; VH Vitreous  hemorrhage; PRP panretinal laser photocoagulation; IVK intravitreal kenalog; VMT vitreomacular traction; MH Macular hole;  NVD neovascularization of the disc; NVE neovascularization elsewhere; AREDS age related eye disease study; ARMD age related macular degeneration; POAG primary open angle glaucoma; EBMD epithelial/anterior basement membrane dystrophy; ACIOL anterior chamber intraocular lens; IOL intraocular lens; PCIOL posterior chamber intraocular lens; Phaco/IOL phacoemulsification with intraocular lens placement; [place] photorefractive keratectomy; LASIK laser assisted in situ keratomileusis; HTN hypertension; DM diabetes mellitus; COPD chronic obstructive pulmonary disease

## 2020-12-25 ENCOUNTER — Other Ambulatory Visit: Payer: Self-pay

## 2020-12-25 DIAGNOSIS — I428 Other cardiomyopathies: Secondary | ICD-10-CM

## 2021-01-02 ENCOUNTER — Encounter (INDEPENDENT_AMBULATORY_CARE_PROVIDER_SITE_OTHER): Payer: Self-pay

## 2021-01-02 ENCOUNTER — Encounter (INDEPENDENT_AMBULATORY_CARE_PROVIDER_SITE_OTHER): Payer: Medicaid Other | Admitting: Ophthalmology

## 2021-01-02 DIAGNOSIS — H3581 Retinal edema: Secondary | ICD-10-CM

## 2021-02-08 NOTE — Progress Notes (Signed)
[facility] Note  02/13/2021     CHIEF COMPLAINT Patient presents for Retina Evaluation  HISTORY OF PRESENT ILLNESS: Andrew Chan is a 66 y.o. male who presents to the clinic today for:   HPI     Retina Evaluation   In both eyes.  This started 15 years ago.  Duration of 3 years.  Associated Symptoms Flashes and Floaters.  I, the attending physician,  performed the HPI with the patient and updated documentation appropriately.        Comments   New pt referred for diabetic retinopathy OU, referred by [facility]. Pt states he has been diabetic since 2008. He reports blurry vision in OS, says it started 3-4 years ago. He reports his sugar levels fluctuate, sugar this am was 109. Last A1C 7.4 in Nov 2022. Pt reports being on dialysis, M/W/F. He is on the kidney transplant list. He mentioned having a laser procedure OS last year to 'fix' his eye and it helped a bit but cannot recall where it was done at. Pt does not have list of meds with him. He does report eye disease in his family, glaucoma or AMD but unsure which.       Last edited by Bernarda Caffey, MD on 02/14/2021 12:42 AM.      Referring physician: Inc, [place],  [place] [id number]  HISTORICAL INFORMATION:   Selected notes from the [place] [facility] for eval of diabetic retinopathy   CURRENT MEDICATIONS: No current outpatient medications on file. (Ophthalmic Drugs)   No current facility-administered medications for this visit. (Ophthalmic Drugs)   Current Outpatient Medications (Other)  Medication Sig   acetaminophen (TYLENOL) 500 MG tablet Take 500 mg by mouth every 6 (six) hours as needed for moderate pain or headache.   aspirin EC 81 MG tablet Take 81 mg by mouth daily. Swallow whole. NOT taken on dialysis days.   atorvastatin (LIPITOR) 80 MG tablet Take 80 mg by mouth daily.   AURYXIA 1 GM 210 MG(Fe) tablet  Take 210 mg by mouth 3 (three) times daily.   Cholecalciferol 50 MCG (2000 UT) CAPS Take 2,000 mg by mouth daily.   insulin glargine (LANTUS) 100 UNIT/ML injection Inject 4 Units into the skin at bedtime as needed (for blood sugar over 150).   Methoxy PEG-Epoetin Beta (MIRCERA IJ) Mircera   sucroferric oxyhydroxide (VELPHORO) 500 MG chewable tablet Chew 500 mg by mouth See admin instructions. Take 500 mg with each meal and 500 mg with each snack   No current facility-administered medications for this visit. (Other)   REVIEW OF SYSTEMS: ROS   Positive for: Genitourinary, Endocrine, Cardiovascular Negative for: Constitutional, Gastrointestinal, Neurological, Skin, Musculoskeletal, HENT, Eyes, Respiratory, Psychiatric, Allergic/Imm, Heme/Lymph Last edited by Kingsley Spittle, COT on 02/13/2021  1:34 PM.     ALLERGIES Allergies  Allergen Reactions   Advil [Ibuprofen]     Stomach problems   PAST MEDICAL HISTORY Past Medical History:  Diagnosis Date   Anemia in chronic kidney disease    Colon cancer ([place])    Diabetes mellitus without complication ([place])    DVT (deep venous thrombosis) (HCC)    left upper extremity   End stage renal disease ([place])    Family history of colon cancer    Glaucoma    Hammertoes of both feet    Heart failure ([place])    Hypertension  Iron deficiency anemia    Mixed hyperlipidemia    Nerve damage    Nonischemic cardiomyopathy (HCC)    NSTEMI (non-ST elevated myocardial infarction) (HCC)    PVD (peripheral vascular disease) ([place])    Retinal edema    Secondary hyperparathyroidism of renal origin ([place])    Senile nuclear sclerosis    Vitamin D deficiency    Past Surgical History:  Procedure Laterality Date   CARDIAC CATHETERIZATION Left 12/08/2014   Procedure: Left Heart Cath and Coronary Angiography;  Surgeon: Dionisio David, MD;  Location: [facility];  Service: Cardiovascular;  Laterality: Left;   CARDIAC CATHETERIZATION N/A 12/09/2014    Procedure: Coronary Stent Intervention;  Surgeon: Yolonda Kida, MD;  Location: [name];  Service: Cardiovascular;  Laterality: N/A;   COLONOSCOPY     IR DIALY SHUNT INTRO NEEDLE/INTRACATH INITIAL W/IMG RIGHT Right 05/20/2019   IR RADIOLOGIST EVAL & MGMT  03/30/2019   IR RADIOLOGIST EVAL & MGMT  05/03/2020   FAMILY HISTORY Family History  Problem Relation Age of Onset   CAD Mother    Heart failure Mother    Kidney failure Father    Diabetes Mellitus II Sister    Heart disease Sister    Colon cancer Sister    CAD Brother    Colon cancer Brother    Colon polyps Brother    Colon cancer Brother    Seizures Son    Other Daughter        died at birth   SOCIAL HISTORY Social History   Tobacco Use   Smoking status: Never   Smokeless tobacco: Never  Vaping Use   Vaping Use: Never used  Substance Use Topics   Alcohol use: Not Currently   Drug use: Yes    Types: Cocaine       OPHTHALMIC EXAM:  Base Eye Exam     Visual Acuity (Snellen - Linear)       Right Left   Dist cc 20/30 -2 20/60 -2   Dist ph cc NI NI    Correction: Glasses         Tonometry (Tonopen, 1:55 PM)       Right Left   Pressure 20 20         Pupils       Dark Light Shape React APD   Right 2 2 Round Minimal None   Left 2 2 Round Minimal None         Visual Fields (Counting fingers)       Left Right    Full Full         Extraocular Movement       Right Left    Full, Ortho Full, Ortho         Neuro/Psych     Oriented x3: Yes   Mood/Affect: Normal         Dilation     Both eyes: 1.0% Mydriacyl, 2.5% Phenylephrine @ 1:56 PM           Slit Lamp and Fundus Exam     Slit Lamp Exam       Right Left   Lids/Lashes Dermato, mild MGD Dermato   Conjunctiva/Sclera Melanosis Mild melanosis   Cornea Mild arcus, 1+ PEE Mild arcus, 1+ PEE   Anterior Chamber Deep and clear Deep and clear   Iris Round and dilated, no NVI Round and dilated, no NVI   Lens  2-3 NS, 2-3 cortical, mild brunescence 2-3  NS, 2-3 Cortical   Anterior Vitreous Synerisis Synerisis         Fundus Exam       Right Left   Disc Mild pallor, sharp rim Mild pallor, sharp rim   C/D Ratio 0.5 0.4   Macula Flat, blunted foveal reflex, scattered MA and focal exudates-greatest temporal macula Blunted foveal reflex, ERM, tractional fibrosis along ST arcades   Vessels Attenuated, tortuous, mild copper wiring, AV crossing changes Attenuated, tortuous, +fibrosis along ST arcades   Periphery Attached, scattered MA/DBH Attached, PRP laser changes 360 w/room for fill-in, scattered MA           Refraction     Wearing Rx       Sphere Cylinder Axis Add   Right -0.25 +0.75 014 +2.25   Left +0.50 +1.25 162 +2.25         Manifest Refraction       Sphere Cylinder Axis Dist VA   Right -0.25 +0.75 015 20/30-2   Left -0.25 +2.00 160 20/60-2           IMAGING AND PROCEDURES  Imaging and Procedures for 02/13/2021  OCT, Retina - OU - Both Eyes       Right Eye Quality was good. Central Foveal Thickness: 210. Progression has no prior data. Findings include normal foveal contour, no IRF, intraretinal hyper-reflective material, no SRF (Focal IRHM temporal macula, scattered cystic changes, diffuse retinal thinning.).   Left Eye Quality was good. Central Foveal Thickness: 356. Progression has no prior data. Findings include abnormal foveal contour, intraretinal fluid, no SRF, epiretinal membrane, macular pucker, intraretinal hyper-reflective material (ERM w/central thickening and pucker, +cystic changes superior macula, significant tractional edema along ST arcades caught on widefield.).   Notes *Images captured and stored on drive  Diagnosis / Impression:  OD: Focal IRHM temporal macula, scattered cystic changes, diffuse retinal thinning. OS: ERM w/ central thickening and pucker, +cystic changes superior macula, significant tractional edema along ST arcades caught on  widefield.  Clinical management:  See below  Abbreviations: NFP - Normal foveal profile. CME - cystoid macular edema. PED - pigment epithelial detachment. IRF - intraretinal fluid. SRF - subretinal fluid. EZ - ellipsoid zone. ERM - epiretinal membrane. ORA - outer retinal atrophy. ORT - outer retinal tubulation. SRHM - subretinal hyper-reflective material. IRHM - intraretinal hyper-reflective material      Fluorescein Angiography Optos (Transit OS)       Right Eye Progression has no prior data. Early phase findings include vascular perfusion defect, retinal neovascularization, microaneurysm. Mid/Late phase findings include vascular perfusion defect, retinal neovascularization, leakage, microaneurysm (Scattered focal NVE -- greatest SN quadrant).   Left Eye Progression has no prior data. Early phase findings include delayed filling, staining, retinal neovascularization, vascular perfusion defect, microaneurysm. Mid/Late phase findings include leakage, retinal neovascularization, microaneurysm, staining (Leaking NV just above ST arcades).   Notes **Images stored on drive**  Impression: PDR OU OD: scattered focal NVE, greatest SN quad OS: band of leaking NVE superior to disc +MA OU             ASSESSMENT/PLAN:    ICD-10-CM   1. Proliferative diabetic retinopathy of both eyes with macular edema associated with type 2 diabetes mellitus (HCC)  E11.3513 OCT, Retina - OU - Both Eyes    Fluorescein Angiography Optos (Transit OS)    2. Epiretinal membrane (ERM) of left eye  H35.372     3. Essential hypertension  I10     4. Hypertensive retinopathy of both eyes  H35.033 Fluorescein Angiography Optos (Transit OS)    5. Combined form of age-related cataract, both eyes  H25.813      1. Proliferative diabetic retinopathy w/ DME, OU (OS > OD)  - A1c 7.4 on 11.1.22  - s/p PRP OS (unknown previous retina provider) - The incidence, risk factors for progression, natural history and  treatment options for diabetic retinopathy were discussed with patient.   - The need for close monitoring of blood glucose, blood pressure, and serum lipids, avoiding cigarette or any type of tobacco, and the need for long term follow up was also discussed with patient. - exam shows scattered MA/DBH - FA today ([id number]) shows late-leaking MA, vascular nonperfusion, +NVE OU (OS > OD) -- will benefit from PRP OU - OCT OD: Focal IRHM temporal macula, scattered cystic changes, diffuse retinal thinning.; OS: ERM w/central thickening and pucker, +cystic changes superior macula, significant tractional edema along ST arcades caught on widefield. - discussed findings and prognosis - recommend PRP OU, OS first - f/u in 2-4 wks for FA review, PRP OS   2. Epiretinal membrane, OS - The natural history, anatomy, potential for loss of vision, and treatment options including vitrectomy techniques and the complications of endophthalmitis, retinal detachment, vitreous hemorrhage, cataract progression and permanent vision loss discussed with the patient. - ERM superior to disc w/ traction - BCVA 20/60 - monitor for now  3,4. Hypertensive retinopathy OU - discussed importance of tight BP control - monitor   5. Mixed Cataract OU - The symptoms of cataract, surgical options, and treatments and risks were discussed with patient. - discussed diagnosis and progression - monitor  Ophthalmic Meds Ordered this visit:  No orders of the defined types were placed in this encounter.    Return for 4-6 wk f/u for mod NPDR OU w/DFE&OCT.  There are no Patient Instructions on file for this visit.  Explained the diagnoses, plan, and follow up with the patient and they expressed understanding.  Patient expressed understanding of the importance of proper follow up care.   This document serves as a record of services personally performed by Gardiner Sleeper, MD, PhD. It was created on their behalf by Estill Bakes, COT an  ophthalmic technician. The creation of this record is the provider's dictation and/or activities during the visit.    Electronically signed by: Estill Bakes, COT 1.5.23 @ 1:09 AM   Gardiner Sleeper, M.D., Ph.D. Diseases & Surgery of the Retina and Vitreous [facility] 1.10.23  I have reviewed the above documentation for accuracy and completeness, and I agree with the above. Gardiner Sleeper, M.D., Ph.D. 02/14/21 1:09 AM   Abbreviations: M myopia (nearsighted); A astigmatism; H hyperopia (farsighted); P presbyopia; Mrx spectacle prescription;  CTL contact lenses; OD right eye; OS left eye; OU both eyes  XT exotropia; ET esotropia; PEK punctate epithelial keratitis; PEE punctate epithelial erosions; DES dry eye syndrome; MGD meibomian gland dysfunction; ATs artificial tears; PFAT's preservative free artificial tears; [place] nuclear sclerotic cataract; PSC posterior subcapsular cataract; ERM epi-retinal membrane; PVD posterior vitreous detachment; RD retinal detachment; DM diabetes mellitus; DR diabetic retinopathy; NPDR non-proliferative diabetic retinopathy; PDR proliferative diabetic retinopathy; CSME clinically significant macular edema; DME diabetic macular edema; dbh dot blot hemorrhages; CWS cotton wool spot; POAG primary open angle glaucoma; C/D cup-to-disc ratio; HVF humphrey visual field; GVF goldmann visual field; OCT optical coherence tomography; IOP intraocular pressure; BRVO Branch retinal vein occlusion; CRVO central retinal vein occlusion; CRAO central retinal artery occlusion;  BRAO branch retinal artery occlusion; RT retinal tear; SB scleral buckle; PPV pars plana vitrectomy; VH Vitreous hemorrhage; PRP panretinal laser photocoagulation; IVK intravitreal kenalog; VMT vitreomacular traction; MH Macular hole;  NVD neovascularization of the disc; NVE neovascularization elsewhere; AREDS age related eye disease study; ARMD age related macular degeneration; POAG primary open  angle glaucoma; EBMD epithelial/anterior basement membrane dystrophy; ACIOL anterior chamber intraocular lens; IOL intraocular lens; PCIOL posterior chamber intraocular lens; Phaco/IOL phacoemulsification with intraocular lens placement; [place] photorefractive keratectomy; LASIK laser assisted in situ keratomileusis; HTN hypertension; DM diabetes mellitus; COPD chronic obstructive pulmonary disease

## 2021-02-13 ENCOUNTER — Ambulatory Visit (INDEPENDENT_AMBULATORY_CARE_PROVIDER_SITE_OTHER): Payer: Medicaid Other | Admitting: Ophthalmology

## 2021-02-13 ENCOUNTER — Encounter (INDEPENDENT_AMBULATORY_CARE_PROVIDER_SITE_OTHER): Payer: Self-pay | Admitting: Ophthalmology

## 2021-02-13 ENCOUNTER — Other Ambulatory Visit: Payer: Self-pay

## 2021-02-13 DIAGNOSIS — H35372 Puckering of macula, left eye: Secondary | ICD-10-CM | POA: Diagnosis not present

## 2021-02-13 DIAGNOSIS — E133393 Other specified diabetes mellitus with moderate nonproliferative diabetic retinopathy without macular edema, bilateral: Secondary | ICD-10-CM

## 2021-02-13 DIAGNOSIS — E113513 Type 2 diabetes mellitus with proliferative diabetic retinopathy with macular edema, bilateral: Secondary | ICD-10-CM

## 2021-02-13 DIAGNOSIS — H3581 Retinal edema: Secondary | ICD-10-CM

## 2021-02-13 DIAGNOSIS — I10 Essential (primary) hypertension: Secondary | ICD-10-CM

## 2021-02-13 DIAGNOSIS — H35033 Hypertensive retinopathy, bilateral: Secondary | ICD-10-CM

## 2021-02-13 DIAGNOSIS — H25813 Combined forms of age-related cataract, bilateral: Secondary | ICD-10-CM

## 2021-02-14 ENCOUNTER — Encounter (INDEPENDENT_AMBULATORY_CARE_PROVIDER_SITE_OTHER): Payer: Self-pay | Admitting: Ophthalmology

## 2021-02-16 ENCOUNTER — Ambulatory Visit (INDEPENDENT_AMBULATORY_CARE_PROVIDER_SITE_OTHER): Payer: Medicaid Other

## 2021-02-16 DIAGNOSIS — I428 Other cardiomyopathies: Secondary | ICD-10-CM | POA: Diagnosis not present

## 2021-02-16 LAB — CUP PACEART REMOTE DEVICE CHECK
Battery Remaining Longevity: 77 mo
Battery Voltage: 2.92 V
Brady Statistic AP VP Percent: 0 %
Brady Statistic AP VS Percent: 0.01 %
Brady Statistic AS VP Percent: 0.04 %
Brady Statistic AS VS Percent: 99.95 %
Brady Statistic RA Percent Paced: 0.01 %
Brady Statistic RV Percent Paced: 0.04 %
Date Time Interrogation Session: 20230113042207
HighPow Impedance: 60 Ohm
Implantable Lead Implant Date: 20180918
Implantable Lead Implant Date: 20180918
Implantable Lead Location: 753859
Implantable Lead Location: 753860
Implantable Lead Model: 5076
Implantable Lead Model: 5076
Implantable Pulse Generator Implant Date: 20180918
Lead Channel Impedance Value: 399 Ohm
Lead Channel Impedance Value: 779 Ohm
Lead Channel Impedance Value: 817 Ohm
Lead Channel Pacing Threshold Amplitude: 0.375 V
Lead Channel Pacing Threshold Amplitude: 0.625 V
Lead Channel Pacing Threshold Pulse Width: 0.4 ms
Lead Channel Pacing Threshold Pulse Width: 0.4 ms
Lead Channel Sensing Intrinsic Amplitude: 4.875 mV
Lead Channel Sensing Intrinsic Amplitude: 4.875 mV
Lead Channel Sensing Intrinsic Amplitude: 9.75 mV
Lead Channel Sensing Intrinsic Amplitude: 9.75 mV
Lead Channel Setting Pacing Amplitude: 1.5 V
Lead Channel Setting Pacing Amplitude: 2 V
Lead Channel Setting Pacing Pulse Width: 0.4 ms
Lead Channel Setting Sensing Sensitivity: 0.3 mV

## 2021-02-23 NOTE — Progress Notes (Shared)
[facility] Note  02/27/2021     CHIEF COMPLAINT Patient presents for No chief complaint on file.  HISTORY OF PRESENT ILLNESS: Andrew Chan is a 66 y.o. male who presents to the clinic today for:    Referring physician: [facility], [place],  [place] [id number]  HISTORICAL INFORMATION:   Selected notes from the [place] [facility] for eval of diabetic retinopathy   CURRENT MEDICATIONS: No current outpatient medications on file. (Ophthalmic Drugs)   No current facility-administered medications for this visit. (Ophthalmic Drugs)   Current Outpatient Medications (Other)  Medication Sig   acetaminophen (TYLENOL) 500 MG tablet Take 500 mg by mouth every 6 (six) hours as needed for moderate pain or headache.   aspirin EC 81 MG tablet Take 81 mg by mouth daily. Swallow whole. NOT taken on dialysis days.   atorvastatin (LIPITOR) 80 MG tablet Take 80 mg by mouth daily.   AURYXIA 1 GM 210 MG(Fe) tablet Take 210 mg by mouth 3 (three) times daily.   Cholecalciferol 50 MCG (2000 UT) CAPS Take 2,000 mg by mouth daily.   insulin glargine (LANTUS) 100 UNIT/ML injection Inject 4 Units into the skin at bedtime as needed (for blood sugar over 150).   Methoxy PEG-Epoetin Beta (MIRCERA IJ) Mircera   sucroferric oxyhydroxide (VELPHORO) 500 MG chewable tablet Chew 500 mg by mouth See admin instructions. Take 500 mg with each meal and 500 mg with each snack   No current facility-administered medications for this visit. (Other)   REVIEW OF SYSTEMS:   ALLERGIES Allergies  Allergen Reactions   Advil [Ibuprofen]     Stomach problems   PAST MEDICAL HISTORY Past Medical History:  Diagnosis Date   Anemia in chronic kidney disease    Colon cancer ([place])    Diabetes mellitus without complication ([place])    DVT (deep venous thrombosis) (HCC)    left upper extremity   End stage renal disease  ([place])    Family history of colon cancer    Glaucoma    Hammertoes of both feet    Heart failure ([place])    Hypertension    Iron deficiency anemia    Mixed hyperlipidemia    Nerve damage    Nonischemic cardiomyopathy (HCC)    NSTEMI (non-ST elevated myocardial infarction) ([place])    PVD (peripheral vascular disease) ([place])    Retinal edema    Secondary hyperparathyroidism of renal origin ([place])    Senile nuclear sclerosis    Vitamin D deficiency    Past Surgical History:  Procedure Laterality Date   CARDIAC CATHETERIZATION Left 12/08/2014   Procedure: Left Heart Cath and Coronary Angiography;  Surgeon: Dionisio David, MD;  Location: [facility];  Service: Cardiovascular;  Laterality: Left;   CARDIAC CATHETERIZATION N/A 12/09/2014   Procedure: Coronary Stent Intervention;  Surgeon: Yolonda Kida, MD;  Location: [facility];  Service: Cardiovascular;  Laterality: N/A;   COLONOSCOPY     IR DIALY SHUNT INTRO NEEDLE/INTRACATH INITIAL W/IMG RIGHT Right 05/20/2019   IR RADIOLOGIST EVAL & MGMT  03/30/2019   IR RADIOLOGIST EVAL & MGMT  05/03/2020   FAMILY HISTORY Family History  Problem Relation Age of Onset   CAD Mother    Heart failure Mother    Kidney failure Father    Diabetes Mellitus II Sister    Heart disease Sister    Colon cancer  Sister    CAD Brother    Colon cancer Brother    Colon polyps Brother    Colon cancer Brother    Seizures Son    Other Daughter        died at birth   SOCIAL HISTORY Social History   Tobacco Use   Smoking status: Never   Smokeless tobacco: Never  Vaping Use   Vaping Use: Never used  Substance Use Topics   Alcohol use: Not Currently   Drug use: Yes    Types: Cocaine       OPHTHALMIC EXAM:  Not recorded    IMAGING AND PROCEDURES  Imaging and Procedures for 02/27/2021            ASSESSMENT/PLAN:  No diagnosis found.  1. Proliferative diabetic retinopathy w/ DME, OU (OS > OD)  - A1c 7.4 on 11.1.22  -  s/p PRP OS (unknown previous retina provider) - The incidence, risk factors for progression, natural history and treatment options for diabetic retinopathy were discussed with patient.   - The need for close monitoring of blood glucose, blood pressure, and serum lipids, avoiding cigarette or any type of tobacco, and the need for long term follow up was also discussed with patient. - exam shows scattered MA/DBH - FA today ([phone]) shows late-leaking MA, vascular nonperfusion, +NVE OU (OS > OD) -- will benefit from PRP OU - OCT OD: Focal IRHM temporal macula, scattered cystic changes, diffuse retinal thinning.; OS: ERM w/central thickening and pucker, +cystic changes superior macula, significant tractional edema along ST arcades caught on widefield. - discussed findings and prognosis - recommend PRP OU, OS first - f/u in 2-4 wks for FA review, PRP OS   2. Epiretinal membrane, OS - The natural history, anatomy, potential for loss of vision, and treatment options including vitrectomy techniques and the complications of endophthalmitis, retinal detachment, vitreous hemorrhage, cataract progression and permanent vision loss discussed with the patient. - ERM superior to disc w/ traction - BCVA 20/60 - monitor for now  3,4. Hypertensive retinopathy OU - discussed importance of tight BP control - monitor   5. Mixed Cataract OU - The symptoms of cataract, surgical options, and treatments and risks were discussed with patient. - discussed diagnosis and progression - monitor  Ophthalmic Meds Ordered this visit:  No orders of the defined types were placed in this encounter.    No follow-ups on file.  There are no Patient Instructions on file for this visit.  Explained the diagnoses, plan, and follow up with the patient and they expressed understanding.  Patient expressed understanding of the importance of proper follow up care.   This document serves as a record of services personally  performed by Gardiner Sleeper, MD, PhD. It was created on their behalf by Estill Bakes, COT an ophthalmic technician. The creation of this record is the provider's dictation and/or activities during the visit.    Electronically signed by: Estill Bakes, COT 1.20.23 @ 12:09 PM   Gardiner Sleeper, M.D., Ph.D. Diseases & Surgery of the Retina and Vitreous [facility]: M myopia (nearsighted); A astigmatism; H hyperopia (farsighted); P presbyopia; Mrx spectacle prescription;  CTL contact lenses; OD right eye; OS left eye; OU both eyes  XT exotropia; ET esotropia; PEK punctate epithelial keratitis; PEE punctate epithelial erosions; DES dry eye syndrome; MGD meibomian gland dysfunction; ATs artificial tears; PFAT's preservative free artificial tears; [place] nuclear sclerotic cataract; PSC posterior subcapsular cataract; ERM epi-retinal  membrane; PVD posterior vitreous detachment; RD retinal detachment; DM diabetes mellitus; DR diabetic retinopathy; NPDR non-proliferative diabetic retinopathy; PDR proliferative diabetic retinopathy; CSME clinically significant macular edema; DME diabetic macular edema; dbh dot blot hemorrhages; CWS cotton wool spot; POAG primary open angle glaucoma; C/D cup-to-disc ratio; HVF humphrey visual field; GVF goldmann visual field; OCT optical coherence tomography; IOP intraocular pressure; BRVO Branch retinal vein occlusion; CRVO central retinal vein occlusion; CRAO central retinal artery occlusion; BRAO branch retinal artery occlusion; RT retinal tear; SB scleral buckle; PPV pars plana vitrectomy; VH Vitreous hemorrhage; PRP panretinal laser photocoagulation; IVK intravitreal kenalog; VMT vitreomacular traction; MH Macular hole;  NVD neovascularization of the disc; NVE neovascularization elsewhere; AREDS age related eye disease study; ARMD age related macular degeneration; POAG primary open angle glaucoma; EBMD epithelial/anterior basement membrane  dystrophy; ACIOL anterior chamber intraocular lens; IOL intraocular lens; PCIOL posterior chamber intraocular lens; Phaco/IOL phacoemulsification with intraocular lens placement; [place] photorefractive keratectomy; LASIK laser assisted in situ keratomileusis; HTN hypertension; DM diabetes mellitus; COPD chronic obstructive pulmonary disease

## 2021-02-27 ENCOUNTER — Encounter (INDEPENDENT_AMBULATORY_CARE_PROVIDER_SITE_OTHER): Payer: Medicaid Other | Admitting: Ophthalmology

## 2021-02-27 DIAGNOSIS — H35372 Puckering of macula, left eye: Secondary | ICD-10-CM

## 2021-02-27 DIAGNOSIS — I10 Essential (primary) hypertension: Secondary | ICD-10-CM

## 2021-02-27 DIAGNOSIS — H35033 Hypertensive retinopathy, bilateral: Secondary | ICD-10-CM

## 2021-02-27 DIAGNOSIS — H25813 Combined forms of age-related cataract, bilateral: Secondary | ICD-10-CM

## 2021-02-27 DIAGNOSIS — E113513 Type 2 diabetes mellitus with proliferative diabetic retinopathy with macular edema, bilateral: Secondary | ICD-10-CM

## 2021-02-27 NOTE — Progress Notes (Signed)
Remote ICD transmission.   

## 2021-03-05 NOTE — Progress Notes (Signed)
[facility] Note  03/06/2021     CHIEF COMPLAINT Patient presents for Retina Follow Up  HISTORY OF PRESENT ILLNESS: Andrew Chan is a 66 y.o. male who presents to the clinic today for:   HPI     Retina Follow Up   Patient presents with  Diabetic Retinopathy.  In both eyes.  This started 3 weeks ago.  I, the attending physician,  performed the HPI with the patient and updated documentation appropriately.        Comments   Patient here for 3 weeks retina follow up for PDR OU. Patient states vision doing pretty good. OS blurry now and then. Most of the time blurry. No eye pain.       Last edited by Bernarda Caffey, MD on 03/07/2021  1:21 PM.       Referring physician: [facility], [place],  [place] [id number]  HISTORICAL INFORMATION:   Selected notes from the [facility] [facility] for eval of diabetic retinopathy   CURRENT MEDICATIONS: Current Outpatient Medications (Ophthalmic Drugs)  Medication Sig   prednisoLONE acetate (PRED FORTE) 1 % ophthalmic suspension Place 1 drop into the left eye 4 (four) times daily for 7 days.   No current facility-administered medications for this visit. (Ophthalmic Drugs)   Current Outpatient Medications (Other)  Medication Sig   acetaminophen (TYLENOL) 500 MG tablet Take 500 mg by mouth every 6 (six) hours as needed for moderate pain or headache.   aspirin EC 81 MG tablet Take 81 mg by mouth daily. Swallow whole. NOT taken on dialysis days.   atorvastatin (LIPITOR) 80 MG tablet Take 80 mg by mouth daily.   AURYXIA 1 GM 210 MG(Fe) tablet Take 210 mg by mouth 3 (three) times daily.   Cholecalciferol 50 MCG (2000 UT) CAPS Take 2,000 mg by mouth daily.   insulin glargine (LANTUS) 100 UNIT/ML injection Inject 4 Units into the skin at bedtime as needed (for blood sugar over 150).   sucroferric oxyhydroxide (VELPHORO) 500 MG chewable tablet  Chew 500 mg by mouth See admin instructions. Take 500 mg with each meal and 500 mg with each snack   No current facility-administered medications for this visit. (Other)   REVIEW OF SYSTEMS: ROS   Positive for: Genitourinary, Endocrine, Cardiovascular Negative for: Constitutional, Gastrointestinal, Neurological, Skin, Musculoskeletal, HENT, Eyes, Respiratory, Psychiatric, Allergic/Imm, Heme/Lymph Last edited by Theodore Demark, COA on 03/06/2021  2:30 PM.      ALLERGIES Allergies  Allergen Reactions   Advil [Ibuprofen]     Stomach problems   PAST MEDICAL HISTORY Past Medical History:  Diagnosis Date   Anemia in chronic kidney disease    Colon cancer ([place])    Diabetes mellitus without complication ([place])    DVT (deep venous thrombosis) (HCC)    left upper extremity   End stage renal disease ([place])    Family history of colon cancer    Glaucoma    Hammertoes of both feet    Heart failure ([place])    Hypertension    Iron deficiency anemia    Mixed hyperlipidemia    Nerve damage    Nonischemic cardiomyopathy ([place])    NSTEMI (non-ST elevated myocardial infarction) ([place])    PVD (peripheral vascular disease) ([place])    Retinal edema    Secondary hyperparathyroidism of renal origin ([place])    Senile nuclear sclerosis    Vitamin D deficiency  Past Surgical History:  Procedure Laterality Date   CARDIAC CATHETERIZATION Left 12/08/2014   Procedure: Left Heart Cath and Coronary Angiography;  Surgeon: Dionisio David, MD;  Location: [facility];  Service: Cardiovascular;  Laterality: Left;   CARDIAC CATHETERIZATION N/A 12/09/2014   Procedure: Coronary Stent Intervention;  Surgeon: Yolonda Kida, MD;  Location: [facility];  Service: Cardiovascular;  Laterality: N/A;   COLONOSCOPY     IR DIALY SHUNT INTRO NEEDLE/INTRACATH INITIAL W/IMG RIGHT Right 05/20/2019   IR RADIOLOGIST EVAL & MGMT  03/30/2019   IR RADIOLOGIST EVAL & MGMT  05/03/2020   FAMILY HISTORY Family  History  Problem Relation Age of Onset   CAD Mother    Heart failure Mother    Kidney failure Father    Diabetes Mellitus II Sister    Heart disease Sister    Colon cancer Sister    CAD Brother    Colon cancer Brother    Colon polyps Brother    Colon cancer Brother    Seizures Son    Other Daughter        died at birth   SOCIAL HISTORY Social History   Tobacco Use   Smoking status: Never   Smokeless tobacco: Never  Vaping Use   Vaping Use: Never used  Substance Use Topics   Alcohol use: Not Currently   Drug use: Yes    Types: Cocaine       OPHTHALMIC EXAM:  Base Eye Exam     Visual Acuity (Snellen - Linear)       Right Left   Dist cc 20/30 -2 20/60 -2   Dist ph cc NI NI    Correction: Glasses         Tonometry (Tonopen, 2:27 PM)       Right Left   Pressure 20 14         Pupils       Dark Light Shape React APD   Right 2 2 Round Minimal None   Left 2 2 Round Minimal None         Visual Fields (Counting fingers)       Left Right    Full Full         Extraocular Movement       Right Left    Full, Ortho Full, Ortho         Neuro/Psych     Oriented x3: Yes   Mood/Affect: Normal         Dilation     Both eyes: 1.0% Mydriacyl, 2.5% Phenylephrine @ 2:27 PM           Slit Lamp and Fundus Exam     Slit Lamp Exam       Right Left   Lids/Lashes Dermato, mild MGD Dermato   Conjunctiva/Sclera Melanosis Mild melanosis   Cornea Mild arcus, 1+ PEE Mild arcus, 1+ PEE   Anterior Chamber Deep and clear Deep and clear   Iris Round and dilated, no NVI Round and dilated, no NVI   Lens 2-3+ NS, 2-3+ cortical, mild brunescence 2-3+ NS, 2-3+ Cortical   Anterior Vitreous Synerisis Synerisis; vit condensations / old VH         Fundus Exam       Right Left   Disc Mild pallor, sharp rim Mild pallor, sharp rim   C/D Ratio 0.5 0.4   Macula Flat, blunted foveal reflex, scattered MA and focal exudates-greatest temporal macula Blunted  foveal  reflex, ERM, tractional fibrosis along ST arcades   Vessels Attenuated, tortuous, mild copper wiring, AV crossing changes Attenuated, tortuous, +fibrosis along ST arcades   Periphery Attached, scattered MA/DBH Attached, PRP laser changes 360 w/room for fill-in, scattered MA           Refraction     Wearing Rx       Sphere Cylinder Axis Add   Right -0.25 +0.75 014 +2.25   Left +0.50 +1.25 162 +2.25           IMAGING AND PROCEDURES  Imaging and Procedures for 03/06/2021  OCT, Retina - OU - Both Eyes       Right Eye Quality was good. Central Foveal Thickness: 208. Progression has been stable. Findings include normal foveal contour, no IRF, intraretinal hyper-reflective material, no SRF (Focal IRHM temporal macula, scattered cystic changes, diffuse retinal thinning.).   Left Eye Quality was good. Central Foveal Thickness: 337. Progression has been stable. Findings include abnormal foveal contour, intraretinal fluid, no SRF, epiretinal membrane, macular pucker, intraretinal hyper-reflective material (ERM w/central thickening and pucker, +cystic changes superior macula, significant tractional edema along ST arcades caught on widefield.).   Notes *Images captured and stored on drive  Diagnosis / Impression:  OD: Focal IRHM temporal macula, scattered cystic changes, diffuse retinal thinning. OS: ERM w/ central thickening and pucker, +cystic changes superior macula, significant tractional edema along ST arcades caught on widefield.  Clinical management:  See below  Abbreviations: NFP - Normal foveal profile. CME - cystoid macular edema. PED - pigment epithelial detachment. IRF - intraretinal fluid. SRF - subretinal fluid. EZ - ellipsoid zone. ERM - epiretinal membrane. ORA - outer retinal atrophy. ORT - outer retinal tubulation. SRHM - subretinal hyper-reflective material. IRHM - intraretinal hyper-reflective material      Panretinal Photocoagulation - OS - Left Eye        Time Out Confirmed correct patient, procedure, site, and patient consented.   Anesthesia Topical anesthesia was used. Anesthetic medications included Proparacaine 0.5%.   Notes LASER PROCEDURE NOTE  Diagnosis:   Proliferative Diabetic Retinopathy, LEFT EYE  Procedure:  Pan-retinal photocoagulation using slit lamp laser, LEFT EYE, fill-in  Anesthesia:  Topical  Surgeon: Bernarda Caffey, MD, PhD   Informed consent obtained, operative eye marked, and time out performed prior to initiation of laser.   [facility]is [id number] slit lamp laser Pattern: 2x2 square, 3x3 square Power: 300 mW Duration: 40 msec  Spot size: 200 microns  # spots: 1154 spots scattered 360, mostly posterior fill in  Notes: significant cortical cataract and scattered vitreous opacities obscuring view and preventing laser up take peripherally and scattered focal areas  RTC: 2 wks for PRP, contralateral eye  Patient tolerated the procedure well and received written and verbal post-procedure care information/education.            ASSESSMENT/PLAN:    ICD-10-CM   1. Proliferative diabetic retinopathy of both eyes with macular edema associated with type 2 diabetes mellitus (HCC)  E11.3513 OCT, Retina - OU - Both Eyes    Panretinal Photocoagulation - OS - Left Eye    2. Epiretinal membrane (ERM) of left eye  H35.372     3. Essential hypertension  I10     4. Hypertensive retinopathy of both eyes  H35.033     5. Combined form of age-related cataract, both eyes  H25.813      1. Proliferative diabetic retinopathy w/ DME, OU (OS > OD)  - A1c 7.4 on 11.1.22  - s/p PRP  OS (unknown previous retina provider) -- room for fill in - exam shows scattered MA/DBH - FA (1.10.2023) shows late-leaking MA, vascular nonperfusion, +NVE OU (OS > OD) -- will benefit from PRP OU - OCT OD: Focal IRHM temporal macula, scattered cystic changes, diffuse retinal thinning.; OS: ERM w/central thickening and pucker, +cystic changes  superior macula, significant tractional edema along ST arcades caught on widefield - discussed findings and prognosis - recommend PRP OS today, 01.31.22 - pt wishes to proceed with laser - RBA of procedure discussed, questions answered - informed consent obtained and signed - see procedure note - start PF QID OS x7 days - f/u in 3-4 wks, PRP OD   2. Epiretinal membrane, OS - ERM superior to disc w/ traction - BCVA 20/60 - monitor for now  3,4. Hypertensive retinopathy OU - discussed importance of tight BP control - monitor   5. Mixed Cataract OU - The symptoms of cataract, surgical options, and treatments and risks were discussed with patient. - discussed diagnosis and progression - cortical cataract was preventing some peripheral uptake -- may recommend cataract surgery to improve monitoring and management of PDR OU  Ophthalmic Meds Ordered this visit:  Meds ordered this encounter  Medications   prednisoLONE acetate (PRED FORTE) 1 % ophthalmic suspension    Sig: Place 1 drop into the left eye 4 (four) times daily for 7 days.    Dispense:  10 mL    Refill:  0     Return for f/u 3-4 weeks, PDR OU, DFE, OCT.  There are no Patient Instructions on file for this visit.  Explained the diagnoses, plan, and follow up with the patient and they expressed understanding.  Patient expressed understanding of the importance of proper follow up care.   This document serves as a record of services personally performed by Gardiner Sleeper, MD, PhD. It was created on their behalf by Estill Bakes, COT an ophthalmic technician. The creation of this record is the provider's dictation and/or activities during the visit.    Electronically signed by: Estill Bakes, COT 1.@ 1:26 PM   This document serves as a record of services personally performed by Gardiner Sleeper, MD, PhD. It was created on their behalf by San Jetty. Owens Shark, OA an ophthalmic technician. The creation of this record is the  provider's dictation and/or activities during the visit.    Electronically signed by: San Jetty. [place], [place] 01.31.2023 1:26 PM  Gardiner Sleeper, M.D., Ph.D. Diseases & Surgery of the Retina and Vitreous [facility]  I have reviewed the above documentation for accuracy and completeness, and I agree with the above. Gardiner Sleeper, M.D., Ph.D. 03/07/21 1:26 PM   Abbreviations: M myopia (nearsighted); A astigmatism; H hyperopia (farsighted); P presbyopia; Mrx spectacle prescription;  CTL contact lenses; OD right eye; OS left eye; OU both eyes  XT exotropia; ET esotropia; PEK punctate epithelial keratitis; PEE punctate epithelial erosions; DES dry eye syndrome; MGD meibomian gland dysfunction; ATs artificial tears; PFAT's preservative free artificial tears; [place] nuclear sclerotic cataract; PSC posterior subcapsular cataract; ERM epi-retinal membrane; PVD posterior vitreous detachment; RD retinal detachment; DM diabetes mellitus; DR diabetic retinopathy; NPDR non-proliferative diabetic retinopathy; PDR proliferative diabetic retinopathy; CSME clinically significant macular edema; DME diabetic macular edema; dbh dot blot hemorrhages; CWS cotton wool spot; POAG primary open angle glaucoma; C/D cup-to-disc ratio; HVF humphrey visual field; GVF goldmann visual field; OCT optical coherence tomography; IOP intraocular pressure; BRVO Branch retinal vein occlusion; CRVO  central retinal vein occlusion; CRAO central retinal artery occlusion; BRAO branch retinal artery occlusion; RT retinal tear; SB scleral buckle; PPV pars plana vitrectomy; VH Vitreous hemorrhage; PRP panretinal laser photocoagulation; IVK intravitreal kenalog; VMT vitreomacular traction; MH Macular hole;  NVD neovascularization of the disc; NVE neovascularization elsewhere; AREDS age related eye disease study; ARMD age related macular degeneration; POAG primary open angle glaucoma; EBMD epithelial/anterior basement membrane  dystrophy; ACIOL anterior chamber intraocular lens; IOL intraocular lens; PCIOL posterior chamber intraocular lens; Phaco/IOL phacoemulsification with intraocular lens placement; [place] photorefractive keratectomy; LASIK laser assisted in situ keratomileusis; HTN hypertension; DM diabetes mellitus; COPD chronic obstructive pulmonary disease

## 2021-03-06 ENCOUNTER — Ambulatory Visit (INDEPENDENT_AMBULATORY_CARE_PROVIDER_SITE_OTHER): Payer: Medicaid Other | Admitting: Ophthalmology

## 2021-03-06 ENCOUNTER — Encounter (INDEPENDENT_AMBULATORY_CARE_PROVIDER_SITE_OTHER): Payer: Self-pay | Admitting: Ophthalmology

## 2021-03-06 ENCOUNTER — Other Ambulatory Visit: Payer: Self-pay

## 2021-03-06 DIAGNOSIS — H35372 Puckering of macula, left eye: Secondary | ICD-10-CM

## 2021-03-06 DIAGNOSIS — I10 Essential (primary) hypertension: Secondary | ICD-10-CM | POA: Diagnosis not present

## 2021-03-06 DIAGNOSIS — H35033 Hypertensive retinopathy, bilateral: Secondary | ICD-10-CM | POA: Diagnosis not present

## 2021-03-06 DIAGNOSIS — E113513 Type 2 diabetes mellitus with proliferative diabetic retinopathy with macular edema, bilateral: Secondary | ICD-10-CM | POA: Diagnosis not present

## 2021-03-06 DIAGNOSIS — H25813 Combined forms of age-related cataract, bilateral: Secondary | ICD-10-CM

## 2021-03-06 MED ORDER — PREDNISOLONE ACETATE 1 % OP SUSP: 1.0000 [drp] | Freq: Four times a day (QID) | OPHTHALMIC | 0 refills | Status: AC

## 2021-03-07 ENCOUNTER — Encounter (INDEPENDENT_AMBULATORY_CARE_PROVIDER_SITE_OTHER): Payer: Self-pay | Admitting: Ophthalmology

## 2021-03-20 NOTE — Progress Notes (Signed)
[facility] Note  03/27/2021     CHIEF COMPLAINT Patient presents for Retina Follow Up  HISTORY OF PRESENT ILLNESS: [facility] is a 66 y.o. male who [facility] to [facility] today for:   HPI     Retina Follow Up   Patient presents with  Diabetic Retinopathy.  In both eyes.  Duration of 3 weeks.  Since onset it is stable.  I, the attending physician,  performed the HPI with the patient and updated documentation appropriately.        Comments   3 week follow up [name] seems better since last visit.  He updated glasses a couple of months ago which has really helped his vision when driving at night.  BS 198 this morning A1C unsure but doctor told his it was good 2 weeks ago he went to [facility] to have assessment to see if he can be on the kidney transplant list.  He will know soon if he makes it on the list.      Last edited by Andrew Caffey, MD on 03/27/2021 11:57 AM.     Referring physician: Inc, [facility],  [facility] [id number]  HISTORICAL INFORMATION:   Selected notes from the [facility] for eval of diabetic retinopathy   CURRENT MEDICATIONS: Current Outpatient Medications (Ophthalmic Drugs)  Medication Sig   dorzolamide-timolol (COSOPT) 22.3-6.8 MG/ML ophthalmic solution Place 1 drop into the right eye 2 (two) times daily.   No current facility-administered medications for this visit. (Ophthalmic Drugs)   Current Outpatient Medications (Other)  Medication Sig   acetaminophen (TYLENOL) 500 MG tablet Take 500 mg by mouth every 6 (six) hours as needed for moderate pain or headache.   aspirin EC 81 MG tablet Take 81 mg by mouth daily. Swallow whole. NOT taken on dialysis days.   atorvastatin (LIPITOR) 80 MG tablet Take 80 mg by mouth daily.   AURYXIA 1 GM 210 MG(Fe) tablet Take 210 mg by mouth 3 (three) times daily.   Cholecalciferol 50 MCG (2000  UT) CAPS Take 2,000 mg by mouth daily.   insulin glargine (LANTUS) 100 UNIT/ML injection Inject 4 Units into the skin at bedtime as needed (for blood sugar over 150).   sucroferric oxyhydroxide (VELPHORO) 500 MG chewable tablet Chew 500 mg by mouth See admin instructions. Take 500 mg with each meal and 500 mg with each snack   No current facility-administered medications for this visit. (Other)   REVIEW OF SYSTEMS: ROS   Positive for: Genitourinary, Endocrine, Cardiovascular Negative for: Constitutional, Gastrointestinal, Neurological, Skin, Musculoskeletal, HENT, Eyes, Respiratory, Psychiatric, Allergic/Imm, Heme/Lymph Last edited by [facility], COA on 03/27/2021  9:16 AM.     ALLERGIES Allergies  Allergen Reactions   Advil [Ibuprofen]     Stomach problems   PAST MEDICAL HISTORY Past Medical History:  Diagnosis Date   Anemia in chronic kidney disease    Colon cancer ([place])    Diabetes mellitus without complication ([place])    DVT (deep venous thrombosis) (HCC)    left upper extremity   End stage renal disease ([place])    Family history of colon cancer    Glaucoma    Hammertoes of both feet    Heart failure ([place])    Hypertension    Iron deficiency anemia    Mixed hyperlipidemia    Nerve damage    Nonischemic cardiomyopathy ([place])  NSTEMI (non-ST elevated myocardial infarction) ([place])    PVD (peripheral vascular disease) ([place])    Retinal edema    Secondary hyperparathyroidism of renal origin ([place])    Senile nuclear sclerosis    Vitamin D deficiency    Past Surgical History:  Procedure Laterality Date   CARDIAC CATHETERIZATION Left 12/08/2014   Procedure: Left Heart Cath and Coronary Angiography;  Surgeon: Andrew David, MD;  Location: [facility];  Service: Cardiovascular;  Laterality: Left;   CARDIAC CATHETERIZATION N/A 12/09/2014   Procedure: Coronary Stent Intervention;  Surgeon: Andrew Kida, MD;  Location: [facility];  Service: Cardiovascular;   Laterality: N/A;   COLONOSCOPY     IR DIALY SHUNT INTRO NEEDLE/INTRACATH INITIAL W/IMG RIGHT Right 05/20/2019   IR RADIOLOGIST EVAL & MGMT  03/30/2019   IR RADIOLOGIST EVAL & MGMT  05/03/2020   FAMILY HISTORY Family History  Problem Relation Age of Onset   CAD Mother    Heart failure Mother    Kidney failure Father    Diabetes Mellitus II Sister    Heart disease Sister    Colon cancer Sister    CAD Brother    Colon cancer Brother    Colon polyps Brother    Colon cancer Brother    Seizures Son    Other Daughter        died at birth   SOCIAL HISTORY Social History   Tobacco Use   Smoking status: Never   Smokeless tobacco: Never  Vaping Use   Vaping Use: Never used  Substance Use Topics   Alcohol use: Not Currently   Drug use: Yes    Types: Cocaine       OPHTHALMIC EXAM:  Base Eye Exam     Visual Acuity (Snellen - Linear)       Right Left   Dist cc 20/30 20/60 -1   Dist ph cc NI NI    Correction: Glasses         Tonometry (Tonopen, 9:24 AM)       Right Left   Pressure 27 15         Tonometry #2 (Tonopen, 9:25 AM)       Right Left   Pressure 25          Tonometry #3 (Applanation, 9:28 AM)       Right Left   Pressure 30          Tonometry #4 (Tonopen, 10:40 AM)       Right Left   Pressure 21          Tonometry Comments   Instilled in office OD: 1 gtt Brimonidine @9 :32 1 gtt Cosopt @9 :37        Pupils       Dark Light Shape React APD   Right 2 1.5 Round Minimal None   Left 2 1.5 Round Minimal None         Visual Fields (Counting fingers)       Left Right    Full Full         Extraocular Movement       Right Left    Full Full         Neuro/Psych     Oriented x3: Yes   Mood/Affect: Normal         Dilation     Right eye: 1.0% Mydriacyl, 2.5% Phenylephrine @ 9:40 AM           Slit Lamp  and Fundus Exam     Slit Lamp Exam       Right Left   Lids/Lashes Dermato, mild MGD Dermato    Conjunctiva/Sclera Melanosis Mild melanosis   Cornea Mild arcus, 1+ PEE Mild arcus, 1+ PEE   Anterior Chamber Deep and clear Deep and clear   Iris Round and dilated, no NVI Round and dilated, no NVI   Lens 2-3+ NS, 2-3+ cortical, mild brunescence 2-3+ NS, 2-3+ Cortical   Anterior Vitreous Synerisis Synerisis; vit condensations / old VH         Fundus Exam       Right Left   Disc Mild pallor, sharp rim Mild pallor, sharp rim   C/D Ratio 0.5 0.4   Macula Flat, blunted foveal reflex, scattered MA and focal exudates-greatest temporal macula Blunted foveal reflex, ERM, tractional fibrosis along ST arcades   Vessels Attenuated, tortuous, mild copper wiring, AV crossing changes Attenuated, tortuous, +fibrosis along ST arcades   Periphery Attached, scattered MA/DBH Attached, PRP laser changes 360 w/room for fill-in, scattered MA           Refraction     Wearing Rx       Sphere Cylinder Axis Add   Right -0.25 +0.75 014 +2.25   Left +0.50 +1.25 162 +2.25           IMAGING AND PROCEDURES  Imaging and Procedures for 03/27/2021  OCT, Retina - OU - Both Eyes       Right Eye Quality was good. Central Foveal Thickness: 209. Progression has been stable. Findings include normal foveal contour, no IRF, intraretinal hyper-reflective material, no SRF (Focal IRHM temporal macula, scattered cystic changes, diffuse retinal thinning.).   Left Eye Quality was good. Central Foveal Thickness: 343. Progression has been stable. Findings include abnormal foveal contour, intraretinal fluid, no SRF, epiretinal membrane, macular pucker, intraretinal hyper-reflective material (ERM w/central thickening and pucker, +cystic changes superior macula, significant tractional edema along ST arcades caught on widefield.).   Notes *Images captured and stored on drive  Diagnosis / Impression:  OD: Focal IRHM temporal macula, scattered cystic changes, diffuse retinal thinning. OS: ERM w/ central thickening and  pucker, +cystic changes superior macula, significant tractional edema along ST arcades caught on widefield.  Clinical management:  See below  Abbreviations: NFP - Normal foveal profile. CME - cystoid macular edema. PED - pigment epithelial detachment. IRF - intraretinal fluid. SRF - subretinal fluid. EZ - ellipsoid zone. ERM - epiretinal membrane. ORA - outer retinal atrophy. ORT - outer retinal tubulation. SRHM - subretinal hyper-reflective material. IRHM - intraretinal hyper-reflective material      Panretinal Photocoagulation - OD - Right Eye       Time Out Confirmed correct patient, procedure, site, and patient consented.   Anesthesia Topical anesthesia was used. Anesthetic medications included Proparacaine 0.5%.   Notes LASER PROCEDURE NOTE  Diagnosis:   Proliferative Diabetic Retinopathy, RIGHT EYE  Procedure:  Pan-retinal photocoagulation using slit lamp laser, RIGHT EYE  Anesthesia:  Topical  Surgeon: Andrew Caffey, MD, PhD   Informed consent obtained, operative eye marked, and time out performed prior to initiation of laser.   Lumenis [id number] slit lamp laser Pattern: 2x2 square, 3x3 square Power: 340 mW Duration: 50 msec  Spot size: 200 microns  # spots: 739 spots -- incomplete due to dense cataract  Complications: None.  Notes: dense nuclear and cortical cataract obscuring view and preventing laser up take diffusely -- very limited laser  RTC: 1 wk for IVA OD  Patient tolerated the procedure well and received written and verbal post-procedure care information/education.            ASSESSMENT/PLAN:    ICD-10-CM   1. Proliferative diabetic retinopathy of both eyes with macular edema associated with type 2 diabetes mellitus (HCC)  E11.3513 OCT, Retina - OU - Both Eyes    Panretinal Photocoagulation - OD - Right Eye    2. Epiretinal membrane (ERM) of left eye  H35.372     3. Essential hypertension  I10     4. Hypertensive retinopathy of both eyes   H35.033     5. Combined form of age-related cataract, both eyes  H25.813     6. Ocular hypertension of right eye  H40.051 dorzolamide-timolol (COSOPT) 22.3-6.8 MG/ML ophthalmic solution    DISCONTINUED: dorzolamide-timolol (COSOPT) 22.3-6.8 MG/ML ophthalmic solution     1. Proliferative diabetic retinopathy w/ DME, OU (OS > OD)  - A1c 7.4 on 11.1.22  - s/p PRP OS (unknown previous retina provider) -- room for fill in  - s/p PRP OS (01.31.23) - exam shows scattered MA/DBH - FA (1.10.2023) shows late-leaking MA, vascular nonperfusion, +NVE OU (OS > OD) -- will benefit from PRP OU - OCT OD: Focal IRHM temporal macula, scattered cystic changes, diffuse retinal thinning.; OS: ERM w/central thickening and pucker, +cystic changes superior macula, significant tractional edema along ST arcades caught on widefield - discussed findings and prognosis - recommend PRP OD today, 02.21.22 - pt wishes to proceed with laser - RBA of procedure discussed, questions answered - informed consent obtained and signed - see procedure note - *laser limited by dense cataract -- recommend cataract eval and IVA OD to treat PDR/NVE - start Lotemax QID OD x7 days - f/u in 03.02.23 for DFE, OCT, possible injection (Avastin OD)  2. Epiretinal membrane, OS - ERM superior to disc w/ traction - BCVA 20/60 - monitor for now  3,4. Hypertensive retinopathy OU - discussed importance of tight BP control - monitor   5. Mixed Cataract OU - The symptoms of cataract, surgical options, and treatments and risks were discussed with patient. - discussed diagnosis and progression - cortical cataract is preventing adequate laser PRP uptake  - recommend cataract eval/possible surgery to improve monitoring and management of PDR OU - will refer to [facility] for cat eval  6. Ocular Hypertension OD  - IOP 30 initially 02.21.23 -- improved w/ 1 drop of Cosopt and Brimonidine - start Cosopt BID OD   Ophthalmic Meds Ordered  this visit:  Meds ordered this encounter  Medications   DISCONTD: dorzolamide-timolol (COSOPT) 22.3-6.8 MG/ML ophthalmic solution    Sig: Place 1 drop into the right eye 2 (two) times daily.    Dispense:  1 mL    Refill:  5   dorzolamide-timolol (COSOPT) 22.3-6.8 MG/ML ophthalmic solution    Sig: Place 1 drop into the right eye 2 (two) times daily.    Dispense:  10 mL    Refill:  5     Return Return April 05, 2021, for DFE, OCT, possible injection.  Patient Instructions     Explained the diagnoses, plan, and follow up with the patient and they expressed understanding.  Patient expressed understanding of the importance of proper follow up care.   This document serves as a record of services personally performed by Gardiner Sleeper, MD, PhD. It was created on their behalf by [facility], an ophthalmic technician. The creation of this record is the provider's dictation and/or  activities during the visit.    Electronically signed by: [facility] COA, 03/27/21  12:17 PM  This document serves as a record of services personally performed by Gardiner Sleeper, MD, PhD. It was created on their behalf by San Jetty. Owens Shark, OA an ophthalmic technician. The creation of this record is the provider's dictation and/or activities during the visit.    Electronically signed by: San Jetty. Marguerita Merles 02.14.2023 12:17 PM   Gardiner Sleeper, M.D., Ph.D. Diseases & Surgery of the Retina and [employer] 03/27/2021  I have reviewed the above documentation for accuracy and completeness, and I agree with the above. Gardiner Sleeper, M.D., Ph.D. 03/27/21 12:17 PM   Abbreviations: M myopia (nearsighted); A astigmatism; H hyperopia (farsighted); P presbyopia; Mrx spectacle prescription;  CTL contact lenses; OD right eye; OS left eye; OU both eyes  XT exotropia; ET esotropia; PEK punctate epithelial keratitis; PEE punctate epithelial erosions; DES dry eye syndrome; MGD meibomian gland  dysfunction; ATs artificial tears; PFAT's preservative free artificial tears; [place] nuclear sclerotic cataract; PSC posterior subcapsular cataract; ERM epi-retinal membrane; PVD posterior vitreous detachment; RD retinal detachment; DM diabetes mellitus; DR diabetic retinopathy; NPDR non-proliferative diabetic retinopathy; PDR proliferative diabetic retinopathy; CSME clinically significant macular edema; DME diabetic macular edema; dbh dot blot hemorrhages; CWS cotton wool spot; POAG primary open angle glaucoma; C/D cup-to-disc ratio; HVF humphrey visual field; GVF goldmann visual field; OCT optical coherence tomography; IOP intraocular pressure; BRVO Branch retinal vein occlusion; CRVO central retinal vein occlusion; CRAO central retinal artery occlusion; BRAO branch retinal artery occlusion; RT retinal tear; SB scleral buckle; PPV pars plana vitrectomy; VH Vitreous hemorrhage; PRP panretinal laser photocoagulation; IVK intravitreal kenalog; VMT vitreomacular traction; MH Macular hole;  NVD neovascularization of the disc; NVE neovascularization elsewhere; AREDS age related eye disease study; ARMD age related macular degeneration; POAG primary open angle glaucoma; EBMD epithelial/anterior basement membrane dystrophy; ACIOL anterior chamber intraocular lens; IOL intraocular lens; PCIOL posterior chamber intraocular lens; Phaco/IOL phacoemulsification with intraocular lens placement; [place] photorefractive keratectomy; LASIK laser assisted in situ keratomileusis; HTN hypertension; DM diabetes mellitus; COPD chronic obstructive pulmonary disease

## 2021-03-27 ENCOUNTER — Encounter (INDEPENDENT_AMBULATORY_CARE_PROVIDER_SITE_OTHER): Payer: Self-pay | Admitting: Ophthalmology

## 2021-03-27 ENCOUNTER — Other Ambulatory Visit: Payer: Self-pay

## 2021-03-27 ENCOUNTER — Encounter (INDEPENDENT_AMBULATORY_CARE_PROVIDER_SITE_OTHER): Payer: Medicaid Other | Admitting: Ophthalmology

## 2021-03-27 ENCOUNTER — Ambulatory Visit (INDEPENDENT_AMBULATORY_CARE_PROVIDER_SITE_OTHER): Payer: Medicaid Other | Admitting: Ophthalmology

## 2021-03-27 DIAGNOSIS — H40051 Ocular hypertension, right eye: Secondary | ICD-10-CM

## 2021-03-27 DIAGNOSIS — H25813 Combined forms of age-related cataract, bilateral: Secondary | ICD-10-CM

## 2021-03-27 DIAGNOSIS — I10 Essential (primary) hypertension: Secondary | ICD-10-CM | POA: Diagnosis not present

## 2021-03-27 DIAGNOSIS — H35033 Hypertensive retinopathy, bilateral: Secondary | ICD-10-CM | POA: Diagnosis not present

## 2021-03-27 DIAGNOSIS — H35372 Puckering of macula, left eye: Secondary | ICD-10-CM | POA: Diagnosis not present

## 2021-03-27 DIAGNOSIS — E113513 Type 2 diabetes mellitus with proliferative diabetic retinopathy with macular edema, bilateral: Secondary | ICD-10-CM | POA: Diagnosis not present

## 2021-03-27 MED ORDER — DORZOLAMIDE HCL-TIMOLOL MAL 2-0.5 % OP SOLN: 1.0000 [drp] | Freq: Two times a day (BID) | OPHTHALMIC | 5 refills | Status: AC

## 2021-03-27 MED ORDER — DORZOLAMIDE HCL-TIMOLOL MAL 2-0.5 % OP SOLN: 1.0000 [drp] | Freq: Two times a day (BID) | OPHTHALMIC | 5 refills | Status: DC

## 2021-03-30 ENCOUNTER — Other Ambulatory Visit: Payer: Self-pay | Admitting: Interventional Radiology

## 2021-03-30 DIAGNOSIS — I739 Peripheral vascular disease, unspecified: Secondary | ICD-10-CM

## 2021-04-02 NOTE — Progress Notes (Addendum)
[facility] Note  04/05/2021     CHIEF COMPLAINT Patient presents for Retina Follow Up  HISTORY OF PRESENT ILLNESS: Andrew Chan is a 66 y.o. male who presents to the clinic today for:   HPI     Retina Follow Up   Patient presents with  Diabetic Retinopathy.  In both eyes.  This started 5 weeks ago.  I, the attending physician,  performed the HPI with the patient and updated documentation appropriately.        Comments   Patient here for 5 weeks retina follow up for PDR OU. Patient states vision pretty good. Sometimes eyes hurt when sun is bright. Sees spots flying across vision when watching TV.       Last edited by Bernarda Caffey, MD on 04/05/2021  4:01 PM.     Referring physician: Inc, [place],  [place] [address]  HISTORICAL INFORMATION:   Selected notes from the [facility] for eval of diabetic retinopathy   CURRENT MEDICATIONS: Current Outpatient Medications (Ophthalmic Drugs)  Medication Sig   dorzolamide-timolol (COSOPT) 22.3-6.8 MG/ML ophthalmic solution Place 1 drop into the right eye 2 (two) times daily.   No current facility-administered medications for this visit. (Ophthalmic Drugs)   Current Outpatient Medications (Other)  Medication Sig   acetaminophen (TYLENOL) 500 MG tablet Take 500 mg by mouth every 6 (six) hours as needed for moderate pain or headache.   aspirin EC 81 MG tablet Take 81 mg by mouth daily. Swallow whole. NOT taken on dialysis days.   atorvastatin (LIPITOR) 80 MG tablet Take 80 mg by mouth daily.   AURYXIA 1 GM 210 MG(Fe) tablet Take 210 mg by mouth 3 (three) times daily.   Cholecalciferol 50 MCG (2000 UT) CAPS Take 2,000 mg by mouth daily.   insulin glargine (LANTUS) 100 UNIT/ML injection Inject 4 Units into the skin at bedtime as needed (for blood sugar over 150).   sucroferric oxyhydroxide (VELPHORO) 500 MG chewable  tablet Chew 500 mg by mouth See admin instructions. Take 500 mg with each meal and 500 mg with each snack   No current facility-administered medications for this visit. (Other)   REVIEW OF SYSTEMS: ROS   Positive for: Genitourinary, Endocrine, Cardiovascular Negative for: Constitutional, Gastrointestinal, Neurological, Skin, Musculoskeletal, HENT, Eyes, Respiratory, Psychiatric, Allergic/Imm, Heme/Lymph Last edited by Theodore Demark, COA on 04/05/2021  2:31 PM.      ALLERGIES Allergies  Allergen Reactions   Advil [Ibuprofen]     Stomach problems   PAST MEDICAL HISTORY Past Medical History:  Diagnosis Date   Anemia in chronic kidney disease    Colon cancer ([place])    Diabetes mellitus without complication ([place])    DVT (deep venous thrombosis) (HCC)    left upper extremity   End stage renal disease ([place])    Family history of colon cancer    Glaucoma    Hammertoes of both feet    Heart failure ([place])    Hypertension    Iron deficiency anemia    Mixed hyperlipidemia    Nerve damage    Nonischemic cardiomyopathy (HCC)    NSTEMI (non-ST elevated myocardial infarction) ([place])    PVD (peripheral vascular disease) ([place])    Retinal edema    Secondary hyperparathyroidism of renal origin ([place])    Senile nuclear sclerosis    Vitamin D deficiency    Past Surgical History:  Procedure Laterality Date   CARDIAC CATHETERIZATION Left 12/08/2014   Procedure: Left Heart Cath and Coronary Angiography;  Surgeon: Dionisio David, MD;  Location: [facility];  Service: Cardiovascular;  Laterality: Left;   CARDIAC CATHETERIZATION N/A 12/09/2014   Procedure: Coronary Stent Intervention;  Surgeon: Yolonda Kida, MD;  Location: [facility];  Service: Cardiovascular;  Laterality: N/A;   COLONOSCOPY     IR DIALY SHUNT INTRO NEEDLE/INTRACATH INITIAL W/IMG RIGHT Right 05/20/2019   IR RADIOLOGIST EVAL & MGMT  03/30/2019   IR RADIOLOGIST EVAL & MGMT  05/03/2020   FAMILY  HISTORY Family History  Problem Relation Age of Onset   CAD Mother    Heart failure Mother    Kidney failure Father    Diabetes Mellitus II Sister    Heart disease Sister    Colon cancer Sister    CAD Brother    Colon cancer Brother    Colon polyps Brother    Colon cancer Brother    Seizures Son    Other Daughter        died at birth   SOCIAL HISTORY Social History   Tobacco Use   Smoking status: Never   Smokeless tobacco: Never  Vaping Use   Vaping Use: Never used  Substance Use Topics   Alcohol use: Not Currently   Drug use: Yes    Types: Cocaine       OPHTHALMIC EXAM:  Base Eye Exam     Visual Acuity (Snellen - Linear)       Right Left   Dist cc 20/40 20/60 -2   Dist ph cc 20/30 -1     Correction: Glasses         Tonometry (Tonopen, 2:28 PM)       Right Left   Pressure 16 14         Pupils       Dark Light Shape React APD   Right 2 1.5 Round Minimal None   Left 2 1.5 Round Minimal None         Visual Fields (Counting fingers)       Left Right    Full Full         Extraocular Movement       Right Left    Full, Ortho Full, Ortho         Neuro/Psych     Oriented x3: Yes   Mood/Affect: Normal         Dilation     Both eyes: 1.0% Mydriacyl, 2.5% Phenylephrine @ 2:28 PM           Slit Lamp and Fundus Exam     Slit Lamp Exam       Right Left   Lids/Lashes Dermato, mild MGD Dermato   Conjunctiva/Sclera Melanosis Mild melanosis   Cornea Mild arcus, 1+ PEE Mild arcus, 1+ PEE   Anterior Chamber Deep and clear Deep and clear   Iris Round and dilated, no NVI Round and dilated, no NVI   Lens 2-3+ NS with mild brunescence, 2-3+ cortical, mild PSC 2-3+ NS, 2-3+ Cortical   Anterior Vitreous Synerisis Synerisis; vit condensations / old VH         Fundus Exam       Right Left   Disc Mild pallor, sharp rim Mild pallor, sharp rim   C/D Ratio 0.5 0.4   Macula Flat, blunted foveal reflex, scattered MA and focal  exudates-greatest temporal macula Blunted foveal reflex,  ERM, tractional fibrosis along ST arcades   Vessels Attenuated, mild tortuosity Attenuated, tortuous, +fibrosis along ST arcades   Periphery Attached, scattered MA/DBH, mild early laser changes greatest nasal and superiorly Attached, PRP laser changes 360 w good fill-in, scattered MA           Refraction     Wearing Rx       Sphere Cylinder Axis Add   Right -0.25 +0.75 014 +2.25   Left +0.50 +1.25 162 +2.25           IMAGING AND PROCEDURES  Imaging and Procedures for 04/05/2021  OCT, Retina - OU - Both Eyes       Right Eye Quality was borderline. Central Foveal Thickness: 217. Progression has been stable. Findings include normal foveal contour, no IRF, intraretinal hyper-reflective material, no SRF (Focal IRHM temporal macula, scattered cystic changes, diffuse retinal thinning.).   Left Eye Quality was good. Central Foveal Thickness: 342. Progression has improved. Findings include abnormal foveal contour, intraretinal fluid, no SRF, epiretinal membrane, macular pucker, intraretinal hyper-reflective material (ERM w/central thickening and pucker, mild interval improvement in cystic changes superior macula, significant tractional edema along ST arcades caught on widefield - stable).   Notes *Images captured and stored on drive  Diagnosis / Impression:  OD: Focal IRHM temporal macula, scattered cystic changes, diffuse retinal thinning. OS: ERM w/central thickening and pucker, mild interval improvement in cystic changes superior macula, significant tractional edema along ST arcades caught on widefield - stable  Clinical management:  See below  Abbreviations: NFP - Normal foveal profile. CME - cystoid macular edema. PED - pigment epithelial detachment. IRF - intraretinal fluid. SRF - subretinal fluid. EZ - ellipsoid zone. ERM - epiretinal membrane. ORA - outer retinal atrophy. ORT - outer retinal tubulation. SRHM -  subretinal hyper-reflective material. IRHM - intraretinal hyper-reflective material      Intravitreal Injection, Pharmacologic Agent - OD - Right Eye       Time Out 04/05/2021. 2:48 PM. Confirmed correct patient, procedure, site, and patient consented.   Anesthesia Topical anesthesia was used. Anesthetic medications included Lidocaine 2%, Proparacaine 0.5%.   Procedure Preparation included 5% betadine to ocular surface, eyelid speculum. A supplied needle was used.   Injection: 1.25 mg Bevacizumab 1.25mg /0.14ml   Route: Intravitreal, Site: Right Eye   NDC: H061816, Lot: [id number] , Expiration date: 04/29/2021   Post-op Post injection exam found visual acuity of at least counting fingers. The patient tolerated the procedure well. There were no complications. The patient received written and verbal post procedure care education.            ASSESSMENT/PLAN:    ICD-10-CM   1. Proliferative diabetic retinopathy of both eyes with macular edema associated with type 2 diabetes mellitus (HCC)  E11.3513 OCT, Retina - OU - Both Eyes    Intravitreal Injection, Pharmacologic Agent - OD - Right Eye    Bevacizumab (AVASTIN) SOLN 1.25 mg    2. Epiretinal membrane (ERM) of left eye  H35.372     3. Essential hypertension  I10     4. Hypertensive retinopathy of both eyes  H35.033     5. Combined form of age-related cataract, both eyes  H25.813     6. Ocular hypertension of right eye  H40.051      1. Proliferative diabetic retinopathy w/ DME, OU (OS > OD)  - A1c 7.4 on 11.1.22  - s/p PRP OS (unknown previous retina provider) -- room for fill in  - s/p PRP OS (01.31.23)  -  s/p PRP OD (02.21.23 - *laser limited by dense cataract*) - exam shows scattered MA/DBH - FA (1.10.2023) shows late-leaking MA, vascular nonperfusion, +NVE OU (OS > OD) -- will benefit from PRP OU - OCT OD: Focal IRHM temporal macula, scattered cystic changes, diffuse retinal thinning; ERM w/central thickening and  pucker, mild interval improvement in cystic changes superior macula, significant tractional edema along ST arcades caught on widefield - stable - discussed findings and prognosis - recommend IVA OD #1 today, 03.02.23 for PDR - pt wishes to proceed with injection - RBA of procedure discussed, questions answered - informed consent obtained and signed - see procedure note - f/u in 4 weeks for DFE, OCT, possible injection(s)  2. Epiretinal membrane, OS - ERM superior to disc w/ traction - BCVA 20/60 - monitor for now  3,4. Hypertensive retinopathy OU - discussed importance of tight BP control - monitor   5. Mixed Cataract OU - The symptoms of cataract, surgical options, and treatments and risks were discussed with patient. - discussed diagnosis and progression - cortical cataract is preventing adequate laser PRP uptake  - recommend cataract eval/possible surgery to improve monitoring and management of PDR OU - pt has appt with Dr. Katy Fitch on March 28 at 10:30  6. Ocular Hypertension OD  - IOP 16 today -- improved on Cosopt - cont Cosopt BID OD   Ophthalmic Meds Ordered this visit:  Meds ordered this encounter  Medications   Bevacizumab (AVASTIN) SOLN 1.25 mg     Return in about 4 weeks (around 05/03/2021) for f/u PDR OU, DFE, OCT.  There are no Patient Instructions on file for this visit.  Explained the diagnoses, plan, and follow up with the patient and they expressed understanding.  Patient expressed understanding of the importance of proper follow up care.   This document serves as a record of services personally performed by Gardiner Sleeper, MD, PhD. It was created on their behalf by San Jetty. Owens Shark, OA an ophthalmic technician. The creation of this record is the provider's dictation and/or activities during the visit.    Electronically signed by: San Jetty. Owens Shark, [place] 02.27.2023 4:10 PM  Gardiner Sleeper, M.D., Ph.D. Diseases & Surgery of the Retina and Vitreous [facility]  I have reviewed the above documentation for accuracy and completeness, and I agree with the above. Gardiner Sleeper, M.D., Ph.D. 04/05/21 4:10 PM   Abbreviations: M myopia (nearsighted); A astigmatism; H hyperopia (farsighted); P presbyopia; Mrx spectacle prescription;  CTL contact lenses; OD right eye; OS left eye; OU both eyes  XT exotropia; ET esotropia; PEK punctate epithelial keratitis; PEE punctate epithelial erosions; DES dry eye syndrome; MGD meibomian gland dysfunction; ATs artificial tears; PFAT's preservative free artificial tears; [place] nuclear sclerotic cataract; PSC posterior subcapsular cataract; ERM epi-retinal membrane; PVD posterior vitreous detachment; RD retinal detachment; DM diabetes mellitus; DR diabetic retinopathy; NPDR non-proliferative diabetic retinopathy; PDR proliferative diabetic retinopathy; CSME clinically significant macular edema; DME diabetic macular edema; dbh dot blot hemorrhages; CWS cotton wool spot; POAG primary open angle glaucoma; C/D cup-to-disc ratio; HVF humphrey visual field; GVF goldmann visual field; OCT optical coherence tomography; IOP intraocular pressure; BRVO Branch retinal vein occlusion; CRVO central retinal vein occlusion; CRAO central retinal artery occlusion; BRAO branch retinal artery occlusion; RT retinal tear; SB scleral buckle; PPV pars plana vitrectomy; VH Vitreous hemorrhage; PRP panretinal laser photocoagulation; IVK intravitreal kenalog; VMT vitreomacular traction; MH Macular hole;  NVD neovascularization of the disc; NVE neovascularization elsewhere; AREDS  age related eye disease study; ARMD age related macular degeneration; POAG primary open angle glaucoma; EBMD epithelial/anterior basement membrane dystrophy; ACIOL anterior chamber intraocular lens; IOL intraocular lens; PCIOL posterior chamber intraocular lens; Phaco/IOL phacoemulsification with intraocular lens placement; [place] photorefractive keratectomy; LASIK laser  assisted in situ keratomileusis; HTN hypertension; DM diabetes mellitus; COPD chronic obstructive pulmonary disease

## 2021-04-05 ENCOUNTER — Other Ambulatory Visit: Payer: Self-pay

## 2021-04-05 ENCOUNTER — Ambulatory Visit (INDEPENDENT_AMBULATORY_CARE_PROVIDER_SITE_OTHER): Payer: Medicaid Other | Admitting: Ophthalmology

## 2021-04-05 ENCOUNTER — Encounter (INDEPENDENT_AMBULATORY_CARE_PROVIDER_SITE_OTHER): Payer: Medicaid Other | Admitting: Ophthalmology

## 2021-04-05 ENCOUNTER — Encounter (INDEPENDENT_AMBULATORY_CARE_PROVIDER_SITE_OTHER): Payer: Self-pay | Admitting: Ophthalmology

## 2021-04-05 DIAGNOSIS — I10 Essential (primary) hypertension: Secondary | ICD-10-CM | POA: Diagnosis not present

## 2021-04-05 DIAGNOSIS — H35372 Puckering of macula, left eye: Secondary | ICD-10-CM | POA: Diagnosis not present

## 2021-04-05 DIAGNOSIS — E113513 Type 2 diabetes mellitus with proliferative diabetic retinopathy with macular edema, bilateral: Secondary | ICD-10-CM

## 2021-04-05 DIAGNOSIS — H25813 Combined forms of age-related cataract, bilateral: Secondary | ICD-10-CM

## 2021-04-05 DIAGNOSIS — H35033 Hypertensive retinopathy, bilateral: Secondary | ICD-10-CM | POA: Diagnosis not present

## 2021-04-05 DIAGNOSIS — H40051 Ocular hypertension, right eye: Secondary | ICD-10-CM

## 2021-04-05 MED ORDER — BEVACIZUMAB CHEMO INJECTION 1.25MG/0.05ML SYRINGE FOR KALEIDOSCOPE
1.2500 mg | INTRAVITREAL | Status: AC | PRN
  Administered 2021-04-05: 1.25 mg via INTRAVITREAL

## 2021-04-23 ENCOUNTER — Encounter: Payer: Self-pay | Admitting: Internal Medicine

## 2021-04-27 DIAGNOSIS — M79676 Pain in unspecified toe(s): Secondary | ICD-10-CM

## 2021-05-01 ENCOUNTER — Inpatient Hospital Stay: Admission: RE | Admit: 2021-05-01 | Payer: Medicaid Other | Source: Ambulatory Visit

## 2021-05-01 ENCOUNTER — Other Ambulatory Visit: Payer: Medicaid Other

## 2021-05-07 NOTE — Progress Notes (Shared)
?[facility] ? ?05/08/2021 ? ?  ? ?CHIEF COMPLAINT ?Patient presents for No chief complaint on file. ? ?HISTORY OF PRESENT ILLNESS: ?Andrew Chan is a 66 y.o. male who presents to the clinic today for:  ? ? ? ?Referring physician: ?[facility], [place] ?[place],  [place] [address] ? ?HISTORICAL INFORMATION:  ? ?Selected notes from the [facility] ?[facility] for eval of diabetic retinopathy  ? ?CURRENT MEDICATIONS: ?Current Outpatient Medications (Ophthalmic Drugs)  ?Medication Sig  ? dorzolamide-timolol (COSOPT) 22.3-6.8 MG/ML ophthalmic solution Place 1 drop into the right eye 2 (two) times daily.  ? ?No current facility-administered medications for this visit. (Ophthalmic Drugs)  ? ?Current Outpatient Medications (Other)  ?Medication Sig  ? acetaminophen (TYLENOL) 500 MG tablet Take 500 mg by mouth every 6 (six) hours as needed for moderate pain or headache.  ? aspirin EC 81 MG tablet Take 81 mg by mouth daily. Swallow whole. NOT taken on dialysis days.  ? atorvastatin (LIPITOR) 80 MG tablet Take 80 mg by mouth daily.  ? AURYXIA 1 GM 210 MG(Fe) tablet Take 210 mg by mouth 3 (three) times daily.  ? Cholecalciferol 50 MCG (2000 UT) CAPS Take 2,000 mg by mouth daily.  ? insulin glargine (LANTUS) 100 UNIT/ML injection Inject 4 Units into the skin at bedtime as needed (for blood sugar over 150).  ? sucroferric oxyhydroxide (VELPHORO) 500 MG chewable tablet Chew 500 mg by mouth See admin instructions. Take 500 mg with each meal and 500 mg with each snack  ? ?No current facility-administered medications for this visit. (Other)  ? ?REVIEW OF SYSTEMS: ? ? ? ?ALLERGIES ?Allergies  ?Allergen Reactions  ? Advil [Ibuprofen]   ?  Stomach problems  ? ?PAST MEDICAL HISTORY ?Past Medical History:  ?Diagnosis Date  ? Anemia in chronic kidney disease   ? Colon cancer ([place])   ? Diabetes mellitus without complication ([place])   ? DVT (deep  venous thrombosis) ([place])   ? left upper extremity  ? End stage renal disease ([place])   ? Family history of colon cancer   ? Glaucoma   ? Hammertoes of both feet   ? Heart failure ([place])   ? Hypertension   ? Iron deficiency anemia   ? Mixed hyperlipidemia   ? Nerve damage   ? Nonischemic cardiomyopathy ([place])   ? NSTEMI (non-ST elevated myocardial infarction) ([place])   ? PVD (peripheral vascular disease) ([place])   ? Retinal edema   ? Secondary hyperparathyroidism of renal origin [facility])   ? Senile nuclear sclerosis   ? Vitamin D deficiency   ? ?Past Surgical History:  ?Procedure Laterality Date  ? CARDIAC CATHETERIZATION Left 12/08/2014  ? Procedure: Left Heart Cath and Coronary Angiography;  Surgeon: Dionisio David, MD;  Location: [facility];  Service: Cardiovascular;  Laterality: Left;  ? CARDIAC CATHETERIZATION N/A 12/09/2014  ? Procedure: Coronary Stent Intervention;  Surgeon: Yolonda Kida, MD;  Location: [facility];  Service: Cardiovascular;  Laterality: N/A;  ? COLONOSCOPY    ? IR DIALY SHUNT INTRO NEEDLE/INTRACATH INITIAL W/IMG RIGHT Right 05/20/2019  ? IR RADIOLOGIST EVAL & MGMT  03/30/2019  ? IR RADIOLOGIST EVAL & MGMT  05/03/2020  ? ?FAMILY HISTORY ?Family History  ?Problem Relation Age of Onset  ? CAD Mother   ? Heart failure Mother   ? Kidney failure Father   ? Diabetes Mellitus  II Sister   ? Heart disease Sister   ? Colon cancer Sister   ? CAD Brother   ? Colon cancer Brother   ? Colon polyps Brother   ? Colon cancer Brother   ? Seizures Son   ? Other Daughter   ?     died at birth  ? ?SOCIAL HISTORY ?Social History  ? ?Tobacco Use  ? Smoking status: Never  ? Smokeless tobacco: Never  ?Vaping Use  ? Vaping Use: Never used  ?Substance Use Topics  ? Alcohol use: Not Currently  ? Drug use: Yes  ?  Types: Cocaine  ?  ? ?  ?OPHTHALMIC EXAM: ? ?Not recorded ?  ? ?IMAGING AND PROCEDURES  ?Imaging and Procedures for 05/08/2021 ? ? ?  ?  ? ?  ?ASSESSMENT/PLAN: ? ?  ICD-10-CM   ?1. Proliferative  diabetic retinopathy of both eyes with macular edema associated with type 2 diabetes mellitus ([place])  H47.4259   ?  ?2. Epiretinal membrane (ERM) of left eye  H35.372   ?  ?3. Essential hypertension  I10   ?  ?4. Hypertensive retinopathy of both eyes  H35.033   ?  ?5. Combined form of age-related cataract, both eyes  H25.813   ?  ?6. Ocular hypertension of right eye  H40.051   ?  ? ?1. Proliferative diabetic retinopathy w/ DME, OU (OS > OD) ? - A1c 7.4 on 11.1.22 ? - s/p PRP OS (unknown previous retina provider) -- room for fill in ? - s/p PRP OS (01.31.23) ? - s/p PRP OD (02.21.23 - *laser limited by dense cataract*) ? - IVA #1 (03.02.23) ?- exam shows scattered MA/DBH ?- FA (1.10.2023) shows late-leaking MA, vascular nonperfusion, +NVE OU (OS > OD) -- will benefit from PRP OU ?- OCT OD: Focal IRHM temporal macula, scattered cystic changes, diffuse retinal thinning; ERM w/central thickening and pucker, mild interval improvement in cystic changes superior macula, significant tractional edema along ST arcades caught on widefield - stable ?- discussed findings and prognosis ?- recommend IVA OD #2 today, 04.03.23 for PDR ?- pt wishes to proceed with injection ?- RBA of procedure discussed, questions answered ?- informed consent obtained and signed ?- see procedure note ?- f/u in 4 weeks for DFE, OCT, possible injection(s) ? ?2. Epiretinal membrane, OS ?- ERM superior to disc w/ traction ?- BCVA 20/60 ?- monitor for now ? ?3,4. Hypertensive retinopathy OU ?- discussed importance of tight BP control ?- monitor  ? ?5. Mixed Cataract OU ?- The symptoms of cataract, surgical options, and treatments and risks were discussed with patient. ?- discussed diagnosis and progression ?- cortical cataract is preventing adequate laser PRP uptake  ?- recommend cataract eval/possible surgery to improve monitoring and management of PDR OU ?- pt has appt with Dr. Katy Fitch on March 28 at 10:30 ? ?6. Ocular Hypertension OD ? - IOP 16 today --  improved on Cosopt ?- cont Cosopt BID OD ?  ?Ophthalmic Meds Ordered this visit:  ?No orders of the defined types were placed in this encounter. ?  ?No follow-ups on file. ? ?There are no Patient Instructions on file for this visit. ? ?Explained the diagnoses, plan, and follow up with the patient and they expressed understanding.  Patient expressed understanding of the importance of proper follow up care.  ? ?This document serves as a record of services personally performed by Gardiner Sleeper, MD, PhD. It was created on their behalf by San Jetty. Owens Shark, OA an ophthalmic  technician. The creation of this record is the provider's dictation and/or activities during the visit.   ? ?Electronically signed by: San Jetty. Owens Shark, [place] 04.03.2023 9:13 AM ? ? ?Gardiner Sleeper, M.D., Ph.D. ?Diseases & Surgery of the Retina and Vitreous ?[place] ? ? ? ? ?Abbreviations: ?M myopia (nearsighted); A astigmatism; H hyperopia (farsighted); P presbyopia; Mrx spectacle prescription;  CTL contact lenses; OD right eye; OS left eye; OU both eyes  XT exotropia; ET esotropia; PEK punctate epithelial keratitis; PEE punctate epithelial erosions; DES dry eye syndrome; MGD meibomian gland dysfunction; ATs artificial tears; PFAT's preservative free artificial tears; [place] nuclear sclerotic cataract; PSC posterior subcapsular cataract; ERM epi-retinal membrane; PVD posterior vitreous detachment; RD retinal detachment; DM diabetes mellitus; DR diabetic retinopathy; NPDR non-proliferative diabetic retinopathy; PDR proliferative diabetic retinopathy; CSME clinically significant macular edema; DME diabetic macular edema; dbh dot blot hemorrhages; CWS cotton wool spot; POAG primary open angle glaucoma; C/D cup-to-disc ratio; HVF humphrey visual field; GVF goldmann visual field; OCT optical coherence tomography; IOP intraocular pressure; BRVO Branch retinal vein occlusion; CRVO central retinal vein occlusion; CRAO central retinal  artery occlusion; BRAO branch retinal artery occlusion; RT retinal tear; SB scleral buckle; PPV pars plana vitrectomy; VH Vitreous hemorrhage; PRP panretinal laser photocoagulation; IVK intravitreal kenalog; VMT

## 2021-05-08 ENCOUNTER — Encounter (INDEPENDENT_AMBULATORY_CARE_PROVIDER_SITE_OTHER): Payer: Medicaid Other | Admitting: Ophthalmology

## 2021-05-08 DIAGNOSIS — H40051 Ocular hypertension, right eye: Secondary | ICD-10-CM

## 2021-05-08 DIAGNOSIS — H25813 Combined forms of age-related cataract, bilateral: Secondary | ICD-10-CM

## 2021-05-08 DIAGNOSIS — H35033 Hypertensive retinopathy, bilateral: Secondary | ICD-10-CM

## 2021-05-08 DIAGNOSIS — H35372 Puckering of macula, left eye: Secondary | ICD-10-CM

## 2021-05-08 DIAGNOSIS — E113513 Type 2 diabetes mellitus with proliferative diabetic retinopathy with macular edema, bilateral: Secondary | ICD-10-CM

## 2021-05-08 DIAGNOSIS — I10 Essential (primary) hypertension: Secondary | ICD-10-CM

## 2021-05-17 ENCOUNTER — Ambulatory Visit (INDEPENDENT_AMBULATORY_CARE_PROVIDER_SITE_OTHER): Payer: Medicaid Other | Admitting: Student

## 2021-05-17 ENCOUNTER — Encounter: Payer: Self-pay | Admitting: Student

## 2021-05-17 VITALS — BP 124/70 | HR 76 | Ht 67.0 in | Wt 178.0 lb

## 2021-05-17 DIAGNOSIS — N186 End stage renal disease: Secondary | ICD-10-CM | POA: Diagnosis not present

## 2021-05-17 DIAGNOSIS — R079 Chest pain, unspecified: Secondary | ICD-10-CM | POA: Diagnosis not present

## 2021-05-17 DIAGNOSIS — I2510 Atherosclerotic heart disease of native coronary artery without angina pectoris: Secondary | ICD-10-CM | POA: Diagnosis not present

## 2021-05-17 DIAGNOSIS — I5022 Chronic systolic (congestive) heart failure: Secondary | ICD-10-CM | POA: Diagnosis not present

## 2021-05-17 LAB — CUP PACEART INCLINIC DEVICE CHECK
Battery Remaining Longevity: 73 mo
Battery Voltage: 2.98 V
Brady Statistic AP VP Percent: 0 %
Brady Statistic AP VS Percent: 0.06 %
Brady Statistic AS VP Percent: 0.04 %
Brady Statistic AS VS Percent: 99.9 %
Brady Statistic RA Percent Paced: 0.06 %
Brady Statistic RV Percent Paced: 0.04 %
Date Time Interrogation Session: 20230413124046
HighPow Impedance: 56 Ohm
Implantable Lead Implant Date: 20180918
Implantable Lead Implant Date: 20180918
Implantable Lead Location: 753859
Implantable Lead Location: 753860
Implantable Lead Model: 5076
Implantable Lead Model: 5076
Implantable Pulse Generator Implant Date: 20180918
Lead Channel Impedance Value: 418 Ohm
Lead Channel Impedance Value: 703 Ohm
Lead Channel Impedance Value: 722 Ohm
Lead Channel Pacing Threshold Amplitude: 0.375 V
Lead Channel Pacing Threshold Amplitude: 0.625 V
Lead Channel Pacing Threshold Pulse Width: 0.4 ms
Lead Channel Pacing Threshold Pulse Width: 0.4 ms
Lead Channel Sensing Intrinsic Amplitude: 4.125 mV
Lead Channel Sensing Intrinsic Amplitude: 5.125 mV
Lead Channel Sensing Intrinsic Amplitude: 8.75 mV
Lead Channel Sensing Intrinsic Amplitude: 9.875 mV
Lead Channel Setting Pacing Amplitude: 1.5 V
Lead Channel Setting Pacing Amplitude: 2 V
Lead Channel Setting Pacing Pulse Width: 0.4 ms
Lead Channel Setting Sensing Sensitivity: 0.3 mV

## 2021-05-17 NOTE — Patient Instructions (Signed)
Medication Instructions:  ?Your physician recommends that you continue on your current medications as directed. Please refer to the Current Medication list given to you today. ? ?*If you need a refill on your cardiac medications before your next appointment, please call your pharmacy* ? ? ?Lab Work: ?None ?If you have labs (blood work) drawn today and your tests are completely normal, you will receive your results only by: ?MyChart Message (if you have MyChart) OR ?A paper copy in the mail ?If you have any lab test that is abnormal or we need to change your treatment, we will call you to review the results. ? ? ?Testing/Procedures: ?Your physician has requested that you have a lexiscan myoview. For further information please visit [email or web address]. Please follow instruction sheet, as given.  ? ?Follow-Up: ?At [facility], you and your health needs are our priority.  As part of our continuing mission to provide you with exceptional heart care, we have created designated Provider Care Teams.  These Care Teams include your primary Cardiologist (physician) and Advanced Practice Providers (APPs -  Physician Assistants and Nurse Practitioners) who all work together to provide you with the care you need, when you need it. ? ?We recommend signing up for the patient portal called "MyChart".  Sign up information is provided on this After Visit Summary.  MyChart is used to connect with patients for Virtual Visits (Telemedicine).  Patients are able to view lab/test results, encounter notes, upcoming appointments, etc.  Non-urgent messages can be sent to your provider as well.   ?To learn more about what you can do with MyChart, go to [email or web address].   ? ?Your next appointment:   ?1 year(s) ? ?The format for your next appointment:   ?In Person ? ?Provider:   ?Lars Mage, MD  ? ? ?Important Information About Sugar ? ? ? ? ?  ?

## 2021-05-17 NOTE — Progress Notes (Signed)
? ? ?Electrophysiology Office Note ?Date: 05/17/2021 ? ?ID:  Andrew Chan, DOB [birth year], MRN [id number] ? ?PCP: [facility] [place] ?Primary Cardiologist: None ?Electrophysiologist: Vickie Epley, MD  ? ?CC: Routine ICD follow-up ? ?Andrew Chan is a 66 y.o. male seen today for Vickie Epley, MD for routine electrophysiology followup.  Since last being seen in our clinic the patient reports doing well overall.  He has noticed atypical chest pain. Describes it as more of an ache/discomfort than a sharp, stabbing, or burning pain. No clear relation to exertion. he denies palpitations, dyspnea, PND, orthopnea, nausea, vomiting, dizziness, syncope, edema, weight gain, or early satiety. He has not had ICD shocks.  ? ?Device History: ?Medtronic Dual Chamber ICD implanted 10/2016 for NICM ? ?Past Medical History:  ?Diagnosis Date  ? Anemia in chronic kidney disease   ? Colon cancer ([place])   ? Diabetes mellitus without complication ([place])   ? DVT (deep venous thrombosis) ([place])   ? left upper extremity  ? End stage renal disease ([place])   ? Family history of colon cancer   ? Glaucoma   ? Hammertoes of both feet   ? Heart failure ([place])   ? Hypertension   ? Iron deficiency anemia   ? Mixed hyperlipidemia   ? Nerve damage   ? Nonischemic cardiomyopathy ([place])   ? NSTEMI (non-ST elevated myocardial infarction) ([place])   ? PVD (peripheral vascular disease) ([place])   ? Retinal edema   ? Secondary hyperparathyroidism of renal origin [facility])   ? Senile nuclear sclerosis   ? Vitamin D deficiency   ? ?Past Surgical History:  ?Procedure Laterality Date  ? CARDIAC CATHETERIZATION Left 12/08/2014  ? Procedure: Left Heart Cath and Coronary Angiography;  Surgeon: Dionisio David, MD;  Location: [place];  Service: Cardiovascular;  Laterality: Left;  ? CARDIAC CATHETERIZATION N/A 12/09/2014  ? Procedure: Coronary Stent Intervention;  Surgeon: Yolonda Kida, MD;  Location: [place] [place] [facility];  Service:  Cardiovascular;  Laterality: N/A;  ? COLONOSCOPY    ? IR DIALY SHUNT INTRO NEEDLE/INTRACATH INITIAL W/IMG RIGHT Right 05/20/2019  ? IR RADIOLOGIST EVAL & MGMT  03/30/2019  ? IR RADIOLOGIST EVAL & MGMT  05/03/2020  ? ? ?Current Outpatient Medications  ?Medication Sig Dispense Refill  ? acetaminophen (TYLENOL) 500 MG tablet Take 500 mg by mouth every 6 (six) hours as needed for moderate pain or headache.    ? apixaban (ELIQUIS) 2.5 MG TABS tablet Take 1 tablet by mouth in the morning and at bedtime.    ? aspirin EC 81 MG tablet Take 81 mg by mouth daily. Swallow whole. NOT taken on dialysis days.    ? atorvastatin (LIPITOR) 80 MG tablet Take 80 mg by mouth daily.    ? AURYXIA 1 GM 210 MG(Fe) tablet Take 210 mg by mouth 3 (three) times daily.    ? Cholecalciferol 50 MCG (2000 UT) CAPS Take 2,000 mg by mouth daily.    ? dorzolamide-timolol (COSOPT) 22.3-6.8 MG/ML ophthalmic solution Place 1 drop into the right eye 2 (two) times daily. 10 mL 5  ? gabapentin (NEURONTIN) 300 MG capsule Take 1 capsule by mouth in the morning, at noon, and at bedtime.    ? insulin glargine (LANTUS) 100 UNIT/ML injection Inject 4 Units into the skin at bedtime as needed (for blood sugar over 150).    ? sucroferric oxyhydroxide (VELPHORO) 500 MG chewable tablet Chew 500 mg by mouth See admin instructions. Take  500 mg with each meal and 500 mg with each snack    ? ?No current facility-administered medications for this visit.  ? ? ?Allergies:   Advil [ibuprofen]  ? ?Social History: ?Social History  ? ?Socioeconomic History  ? Marital status: Divorced  ?  Spouse name: Not on file  ? Number of children: 1  ? Years of education: Not on file  ? Highest education level: Not on file  ?Occupational History  ? Occupation: disable  ?Tobacco Use  ? Smoking status: Never  ? Smokeless tobacco: Never  ?Vaping Use  ? Vaping Use: Never used  ?Substance and Sexual Activity  ? Alcohol use: Not Currently  ? Drug use: Yes  ?  Types: Cocaine  ? Sexual activity: Not  Currently  ?Other Topics Concern  ? Not on file  ?Social History Narrative  ? He is divorced has 1 son he is disabled  ? Dialyzes Monday Wednesday Friday  ? Denies alcohol now but there is a history of that and cocaine use never smoker  ? ?Social Determinants of Health  ? ?Financial Resource Strain: Not on file  ?Food Insecurity: Not on file  ?Transportation Needs: Not on file  ?Physical Activity: Not on file  ?Stress: Not on file  ?Social Connections: Not on file  ?Intimate Partner Violence: Not on file  ? ? ?Family History: ?Family History  ?Problem Relation Age of Onset  ? CAD Mother   ? Heart failure Mother   ? Kidney failure Father   ? Diabetes Mellitus II Sister   ? Heart disease Sister   ? Colon cancer Sister   ? CAD Brother   ? Colon cancer Brother   ? Colon polyps Brother   ? Colon cancer Brother   ? Seizures Son   ? Other Daughter   ?     died at birth  ? ? ?Review of Systems: ?All other systems reviewed and are otherwise negative except as noted above. ? ? ?Physical Exam: ?Vitals:  ? 05/17/21 1200  ?BP: 124/70  ?Pulse: 76  ?SpO2: 99%  ?Weight: 178 lb (80.7 kg)  ?Height: '5\' 7"'$  (1.702 m)  ?  ? ?GEN- The patient is well appearing, alert and oriented x 3 today.   ?HEENT: normocephalic, atraumatic; sclera clear, conjunctiva pink; hearing intact; oropharynx clear; neck supple, no JVP ?Lymph- no cervical lymphadenopathy ?Lungs- Clear to ausculation bilaterally, normal work of breathing.  No wheezes, rales, rhonchi ?Heart- Regular rate and rhythm, no murmurs, rubs or gallops, PMI not laterally displaced ?GI- soft, non-tender, non-distended, bowel sounds present, no hepatosplenomegaly ?Extremities- no clubbing or cyanosis. No edema; DP/PT/radial pulses 2+ bilaterally ?MS- no significant deformity or atrophy ?Skin- warm and dry, no rash or lesion; ICD pocket well healed ?Psych- euthymic mood, full affect ?Neuro- strength and sensation are intact ? ?ICD interrogation- reviewed in detail today,  See [facility]  report ? ?EKG:  EKG is ordered today. ?Personal review of EKG ordered today shows NSR at 76 bpm ? ?Recent Labs: ?No results found for requested labs within last 8760 hours.  ? ?Wt Readings from Last 3 Encounters:  ?05/17/21 178 lb (80.7 kg)  ?09/19/20 179 lb (81.2 kg)  ?06/27/20 178 lb 9.6 oz (81 kg)  ?  ? ?Other studies Reviewed: ?Additional studies/ records that were reviewed today include: Previous EP office notes.  ? ?Assessment and Plan: ? ?1.  Chronic systolic dysfunction s/p Medtronic dual chamber ICD  ?euvolemic today ?Stable on an appropriate medical regimen ?Normal ICD function ?  See [facility]ia [facility] Art report ?No changes today ? ?2. CAD ?With atypical chest pain ?Given his history and co-morbidities, will plan for Myoview for completeness.  ? ?3. DM: Complicated by ESRD.  On insulin. ? ?4. ESRD:  ?Continue HD MWF. ? ?Current medicines are reviewed at length with the patient today.   ? ?Labs/ tests ordered today include:  ?Orders Placed This Encounter  ?Procedures  ? Myocardial Perfusion Imaging  ? EKG 12-Lead  ? ? ?Disposition:   Follow up with Dr. Quentin Ore in 12 months  ? ? ?Signed, ?Shirley Friar, PA-C  ?05/17/2021 ?12:07 PM ? ?[facility] ?[address] ?[address] ?[place] [address] ?[phone] (office) ?[phone] (fax)  ?

## 2021-05-17 NOTE — Addendum Note (Signed)
Addended by: Barrington Ellison A on: 05/17/2021 12:43 PM ? ? Modules accepted: Orders ? ?

## 2021-05-18 ENCOUNTER — Ambulatory Visit (INDEPENDENT_AMBULATORY_CARE_PROVIDER_SITE_OTHER): Payer: Medicaid Other

## 2021-05-18 DIAGNOSIS — I428 Other cardiomyopathies: Secondary | ICD-10-CM

## 2021-05-18 DIAGNOSIS — I5022 Chronic systolic (congestive) heart failure: Secondary | ICD-10-CM | POA: Diagnosis not present

## 2021-05-18 LAB — CUP PACEART REMOTE DEVICE CHECK
Battery Remaining Longevity: 73 mo
Battery Voltage: 2.98 V
Brady Statistic AP VP Percent: 0 %
Brady Statistic AP VS Percent: 0.01 %
Brady Statistic AS VP Percent: 0.03 %
Brady Statistic AS VS Percent: 99.95 %
Brady Statistic RA Percent Paced: 0.02 %
Brady Statistic RV Percent Paced: 0.04 %
Date Time Interrogation Session: 20230414012305
HighPow Impedance: 54 Ohm
Implantable Lead Implant Date: 20180918
Implantable Lead Implant Date: 20180918
Implantable Lead Location: 753859
Implantable Lead Location: 753860
Implantable Lead Model: 5076
Implantable Lead Model: 5076
Implantable Pulse Generator Implant Date: 20180918
Lead Channel Impedance Value: 399 Ohm
Lead Channel Impedance Value: 646 Ohm
Lead Channel Impedance Value: 665 Ohm
Lead Channel Pacing Threshold Amplitude: 0.375 V
Lead Channel Pacing Threshold Amplitude: 0.625 V
Lead Channel Pacing Threshold Pulse Width: 0.4 ms
Lead Channel Pacing Threshold Pulse Width: 0.4 ms
Lead Channel Sensing Intrinsic Amplitude: 4.125 mV
Lead Channel Sensing Intrinsic Amplitude: 5.125 mV
Lead Channel Sensing Intrinsic Amplitude: 8.75 mV
Lead Channel Sensing Intrinsic Amplitude: 9.875 mV
Lead Channel Setting Pacing Amplitude: 1.5 V
Lead Channel Setting Pacing Amplitude: 2 V
Lead Channel Setting Pacing Pulse Width: 0.4 ms
Lead Channel Setting Sensing Sensitivity: 0.3 mV

## 2021-05-30 ENCOUNTER — Telehealth: Payer: Self-pay

## 2021-05-30 NOTE — Telephone Encounter (Signed)
Spoke with the patient's sister per the DPR. She stated that he would be here for his test. And would give him his instructions. Asked to call back with any questions. S.Amaura Authier EMTP ?

## 2021-05-31 ENCOUNTER — Ambulatory Visit
Admission: RE | Admit: 2021-05-31 | Discharge: 2021-05-31 | Disposition: A | Discharge status: Home / Self Care | Payer: Medicaid Other | Source: Ambulatory Visit | Attending: Interventional Radiology | Admitting: Interventional Radiology

## 2021-05-31 DIAGNOSIS — I739 Peripheral vascular disease, unspecified: Secondary | ICD-10-CM

## 2021-06-05 ENCOUNTER — Ambulatory Visit (HOSPITAL_COMMUNITY): Payer: Medicaid Other | Attending: Cardiovascular Disease

## 2021-06-05 DIAGNOSIS — R079 Chest pain, unspecified: Secondary | ICD-10-CM | POA: Diagnosis present

## 2021-06-05 DIAGNOSIS — I2510 Atherosclerotic heart disease of native coronary artery without angina pectoris: Secondary | ICD-10-CM | POA: Insufficient documentation

## 2021-06-05 LAB — MYOCARDIAL PERFUSION IMAGING
LV dias vol: 197 mL (ref 62–150)
LV sys vol: 129 mL
Nuc Stress EF: 34 %
Peak HR: 90 {beats}/min
Rest HR: 73 {beats}/min
Rest Nuclear Isotope Dose: 10.1 mCi
SDS: 5
SRS: 10
SSS: 15
ST Depression (mm): 0 mm
Stress Nuclear Isotope Dose: 30.3 mCi
TID: 0.98

## 2021-06-05 MED ORDER — REGADENOSON 0.4 MG/5ML IV SOLN
0.4000 mg | Freq: Once | INTRAVENOUS | Status: AC
  Administered 2021-06-05: 0.4 mg via INTRAVENOUS

## 2021-06-05 MED ORDER — TECHNETIUM TC 99M TETROFOSMIN IV KIT
30.3000 | PACK | Freq: Once | INTRAVENOUS | Status: AC | PRN
  Administered 2021-06-05: 30.3 via INTRAVENOUS
  Filled 2021-06-05: qty 31

## 2021-06-05 MED ORDER — TECHNETIUM TC 99M TETROFOSMIN IV KIT
10.1000 | PACK | Freq: Once | INTRAVENOUS | Status: AC | PRN
  Administered 2021-06-05: 10.1 via INTRAVENOUS
  Filled 2021-06-05: qty 11

## 2021-06-05 NOTE — Progress Notes (Signed)
Remote ICD transmission.   

## 2021-06-13 ENCOUNTER — Ambulatory Visit
Admission: RE | Admit: 2021-06-13 | Discharge: 2021-06-13 | Disposition: A | Discharge status: Home / Self Care | Payer: Medicaid Other | Source: Ambulatory Visit | Attending: Interventional Radiology | Admitting: Interventional Radiology

## 2021-06-13 DIAGNOSIS — I739 Peripheral vascular disease, unspecified: Secondary | ICD-10-CM

## 2021-06-25 ENCOUNTER — Other Ambulatory Visit: Payer: Self-pay

## 2021-06-25 DIAGNOSIS — I5022 Chronic systolic (congestive) heart failure: Secondary | ICD-10-CM

## 2021-06-25 MED ORDER — ISOSORBIDE MONONITRATE ER 30 MG PO TB24: 15.0000 mg | ORAL_TABLET | Freq: Every day | ORAL | 3 refills | Status: AC

## 2021-07-05 NOTE — Progress Notes (Signed)
[facility] Note  07/17/2021     CHIEF COMPLAINT Patient presents for Retina Follow Up  HISTORY OF PRESENT ILLNESS: Andrew Chan is a 66 y.o. male who presents to the clinic today for:   HPI     Retina Follow Up   Patient presents with  Diabetic Retinopathy.  In both eyes.  Severity is moderate.  Duration of 4 weeks.  Since onset it is stable.  I, the attending physician,  performed the HPI with the patient and updated documentation appropriately.        Comments   Pt here for 4 wk ret f/u for PDR OU. Pt states VA the same, just dealing with itching OU.       Last edited by Bernarda Caffey, MD on 07/17/2021 11:44 PM.    Delayed 3 months, should have been 4 weeks.   Patient did not see Dr. Katy Fitch for a cataract eval.    Referring physician: [facility] [place],  [place] [address]  HISTORICAL INFORMATION:   Selected notes from the [facility] for eval of diabetic retinopathy   CURRENT MEDICATIONS: Current Outpatient Medications (Ophthalmic Drugs)  Medication Sig   dorzolamide-timolol (COSOPT) 22.3-6.8 MG/ML ophthalmic solution Place 1 drop into the right eye 2 (two) times daily.   No current facility-administered medications for this visit. (Ophthalmic Drugs)   Current Outpatient Medications (Other)  Medication Sig   acetaminophen (TYLENOL) 500 MG tablet Take 500 mg by mouth every 6 (six) hours as needed for moderate pain or headache.   apixaban (ELIQUIS) 2.5 MG TABS tablet Take 1 tablet by mouth in the morning and at bedtime.   aspirin EC 81 MG tablet Take 81 mg by mouth daily. Swallow whole. NOT taken on dialysis days.   atorvastatin (LIPITOR) 80 MG tablet Take 80 mg by mouth daily.   AURYXIA 1 GM 210 MG(Fe) tablet Take 210 mg by mouth 3 (three) times daily.   Cholecalciferol 50 MCG (2000 UT) CAPS Take 2,000 mg by mouth daily.   gabapentin (NEURONTIN) 300  MG capsule Take 1 capsule by mouth in the morning, at noon, and at bedtime.   insulin glargine (LANTUS) 100 UNIT/ML injection Inject 4 Units into the skin at bedtime as needed (for blood sugar over 150).   isosorbide mononitrate (IMDUR) 30 MG 24 hr tablet Take 0.5 tablets (15 mg total) by mouth daily.   sucroferric oxyhydroxide (VELPHORO) 500 MG chewable tablet Chew 500 mg by mouth See admin instructions. Take 500 mg with each meal and 500 mg with each snack   No current facility-administered medications for this visit. (Other)   REVIEW OF SYSTEMS: ROS   Positive for: Genitourinary, Endocrine, Cardiovascular Negative for: Constitutional, Gastrointestinal, Neurological, Skin, Musculoskeletal, HENT, Eyes, Respiratory, Psychiatric, Allergic/Imm, Heme/Lymph Last edited by Kingsley Spittle, COT on 07/17/2021  1:54 PM.     ALLERGIES Allergies  Allergen Reactions   Advil [Ibuprofen]     Stomach problems   PAST MEDICAL HISTORY Past Medical History:  Diagnosis Date   Anemia in chronic kidney disease    Colon cancer ([place])    Diabetes mellitus without complication ([place])    DVT (deep venous thrombosis) ([facility])    left upper extremity   End stage renal disease ([place])    Family history of colon cancer    Glaucoma    Hammertoes of both feet    Heart failure (  [place])    Hypertension    Iron deficiency anemia    Mixed hyperlipidemia    Nerve damage    Nonischemic cardiomyopathy ([facility])    NSTEMI (non-ST elevated myocardial infarction) ([place])    PVD (peripheral vascular disease) ([place])    Retinal edema    Secondary hyperparathyroidism of renal origin ([place])    Senile nuclear sclerosis    Vitamin D deficiency    Past Surgical History:  Procedure Laterality Date   CARDIAC CATHETERIZATION Left 12/08/2014   Procedure: Left Heart Cath and Coronary Angiography;  Surgeon: Dionisio David, MD;  Location: [place] [facility];  Service: Cardiovascular;  Laterality: Left;   CARDIAC CATHETERIZATION N/A  12/09/2014   Procedure: Coronary Stent Intervention;  Surgeon: Yolonda Kida, MD;  Location: [facility] [facility];  Service: Cardiovascular;  Laterality: N/A;   COLONOSCOPY     IR DIALY SHUNT INTRO NEEDLE/INTRACATH INITIAL W/IMG RIGHT Right 05/20/2019   IR RADIOLOGIST EVAL & MGMT  03/30/2019   IR RADIOLOGIST EVAL & MGMT  05/03/2020   FAMILY HISTORY Family History  Problem Relation Age of Onset   CAD Mother    Heart failure Mother    Kidney failure Father    Diabetes Mellitus II Sister    Heart disease Sister    Colon cancer Sister    CAD Brother    Colon cancer Brother    Colon polyps Brother    Colon cancer Brother    Seizures Son    Other Daughter        died at birth   SOCIAL HISTORY Social History   Tobacco Use   Smoking status: Never   Smokeless tobacco: Never  Vaping Use   Vaping Use: Never used  Substance Use Topics   Alcohol use: Not Currently   Drug use: Yes    Types: Cocaine       OPHTHALMIC EXAM:  Base Eye Exam     Visual Acuity (Snellen - Linear)       Right Left   Dist cc 20/40 20/70 +1   Dist ph cc 20/40 +1 NI    Correction: Glasses         Tonometry (Tonopen, 2:06 PM)       Right Left   Pressure 22 17         Pupils       Dark Light Shape React APD   Right 2 1.5 Round Minimal None   Left 2 1.5 Round Minimal None         Visual Fields (Counting fingers)       Left Right    Full Full         Extraocular Movement       Right Left    Full, Ortho Full, Ortho         Neuro/Psych     Oriented x3: Yes   Mood/Affect: Normal         Dilation     Both eyes: 1.0% Mydriacyl, 2.5% Phenylephrine @ 2:07 PM           Slit Lamp and Fundus Exam     Slit Lamp Exam       Right Left   Lids/Lashes Dermato, mild MGD Dermato   Conjunctiva/Sclera Melanosis Mild melanosis   Cornea Mild arcus, trace PEE Mild arcus, 1+ PEE   Anterior Chamber Deep and clear Deep and clear   Iris Round and dilated, no NVI Round and  dilated, no NVI  Lens 2-3+ NS with mild brunescence, 2-3+ cortical, mild PSC 2-3+ NS with brunescence, 2-3+ Cortical   Anterior Vitreous Synerisis Synerisis; vit condensations / old VH -- settling inferiorly, improving         Fundus Exam       Right Left   Disc Mild pallor, sharp rim Mild pallor, sharp rim   C/D Ratio 0.5 0.4   Macula Flat, blunted foveal reflex, scattered MA and focal exudates-greatest temporal macula -- improved Blunted foveal reflex, ERM, tractional fibrosis along ST arcades   Vessels Attenuated, mild tortuosity Attenuated, tortuous, +fibrosis along ST arcades   Periphery Attached, scattered MA/DBH, scattered PRP laser changes, room for fill in Attached, PRP laser changes 360 w good fill-in, scattered MA           Refraction     Wearing Rx       Sphere Cylinder Axis Add   Right -0.25 +0.75 014 +2.25   Left +0.50 +1.25 162 +2.25         Manifest Refraction       Sphere Cylinder Axis Dist VA   Right +0.25 +0.25 083 20/40   Left +0.25 +1.25 180 20/70           IMAGING AND PROCEDURES  Imaging and Procedures for 07/17/2021  OCT, Retina - OU - Both Eyes       Right Eye Quality was borderline. Central Foveal Thickness: 209. Progression has improved. Findings include normal foveal contour, no IRF, no SRF, intraretinal hyper-reflective material (Focal IRHM temporal macula, interval improvement in cystic changes, diffuse retinal thinning.).   Left Eye Quality was good. Central Foveal Thickness: 347. Progression has been stable. Findings include no SRF, abnormal foveal contour, intraretinal hyper-reflective material, epiretinal membrane, intraretinal fluid, macular pucker (ERM w/central thickening and pucker, persistent cystic changes superior macula, significant tractional edema along ST arcades caught on widefield - stable).   Notes *Images captured and stored on drive  Diagnosis / Impression:  OD: Focal IRHM temporal macula, interval improvement  in cystic changes, diffuse retinal thinning. OS: ERM w/central thickening and pucker, persistent cystic changes superior macula, significant tractional edema along ST arcades caught on widefield - stable  Clinical management:  See below  Abbreviations: NFP - Normal foveal profile. CME - cystoid macular edema. PED - pigment epithelial detachment. IRF - intraretinal fluid. SRF - subretinal fluid. EZ - ellipsoid zone. ERM - epiretinal membrane. ORA - outer retinal atrophy. ORT - outer retinal tubulation. SRHM - subretinal hyper-reflective material. IRHM - intraretinal hyper-reflective material             ASSESSMENT/PLAN:    ICD-10-CM   1. Proliferative diabetic retinopathy of both eyes with macular edema associated with type 2 diabetes mellitus ([place])  E11.3513 OCT, Retina - OU - Both Eyes    2. Epiretinal membrane (ERM) of left eye  H35.372     3. Essential hypertension  I10     4. Hypertensive retinopathy of both eyes  H35.033     5. Combined form of age-related cataract, both eyes  H25.813     6. Ocular hypertension of right eye  H40.051       1. Proliferative diabetic retinopathy w/ DME, OU (OS > OD)  - f/u delayed to 3 mos instead of 4 wks  - A1c 7.4 on 11.1.22  - s/p PRP OS (unknown previous retina provider) -- room for fill in  - s/p PRP OS (01.31.23)  - s/p PRP OD (02.21.23) - *laser limited by  dense cataract*)  - s/p IVA OD #1 (03.02.23) - exam shows scattered MA/DBH - FA (1.10.2023) shows late-leaking MA, vascular nonperfusion, +NVE OU (OS > OD) -- will benefit from PRP OU - OCT OD: Focal IRHM temporal macula, interval improvement in cystic changes, diffuse retinal thinning; ERM w/central thickening and pucker, persistent cystic changes superior macula, significant tractional edema along ST arcades caught on widefield - stable (delayed 3 months from 4 weeks on 06.13.23) - discussed findings and prognosis - recommend holding treatment at this time - pt agrees - RBA  of procedure discussed, questions answered - informed consent obtained and signed - see procedure note - f/u in 3 months DFE, OCT  2. Epiretinal membrane, OS - ERM superior to disc w/ traction - BCVA 20/60 - monitor  3,4. Hypertensive retinopathy OU - discussed importance of tight BP control - monitor  5. Mixed Cataract OU - The symptoms of cataract, surgical options, and treatments and risks were discussed with patient. - discussed diagnosis and progression - cortical cataract is preventing adequate laser PRP uptake  - recommend cataract eval/possible surgery to improve monitoring and management of PDR OU - pt missed appt, will reschedule him with Dr. Katy Fitch for cataract evaluation   6. Ocular Hypertension OD  - IOP 16 today -- improved on Cosopt - cont Cosopt BID OD   Ophthalmic Meds Ordered this visit:  No orders of the defined types were placed in this encounter.    Return in about 3 months (around 10/17/2021) for DFE, OCT.  There are no Patient Instructions on file for this visit.  Explained the diagnoses, plan, and follow up with the patient and they expressed understanding.  Patient expressed understanding of the importance of proper follow up care.   This document serves as a record of services personally performed by Gardiner Sleeper, MD, PhD. It was created on their behalf by Renaldo Reel, [place] an ophthalmic technician. The creation of this record is the provider's dictation and/or activities during the visit.    Electronically signed by:  Renaldo Reel, COT  07/05/21 11:44 PM  This document serves as a record of services personally performed by Gardiner Sleeper, MD, PhD. It was created on their behalf by Leonie Douglas, an ophthalmic technician. The creation of this record is the provider's dictation and/or activities during the visit.    Electronically signed by: Leonie Douglas COA, 07/17/21  11:44 PM  Gardiner Sleeper, M.D., Ph.D. Diseases & Surgery of the  Retina and Vitreous [facility]  I have reviewed the above documentation for accuracy and completeness, and I agree with the above. Gardiner Sleeper, M.D., Ph.D. 07/17/21 11:47 PM    Abbreviations: M myopia (nearsighted); A astigmatism; H hyperopia (farsighted); P presbyopia; Mrx spectacle prescription;  CTL contact lenses; OD right eye; OS left eye; OU both eyes  XT exotropia; ET esotropia; PEK punctate epithelial keratitis; PEE punctate epithelial erosions; DES dry eye syndrome; MGD meibomian gland dysfunction; ATs artificial tears; PFAT's preservative free artificial tears; [place] nuclear sclerotic cataract; PSC posterior subcapsular cataract; ERM epi-retinal membrane; PVD posterior vitreous detachment; RD retinal detachment; DM diabetes mellitus; DR diabetic retinopathy; NPDR non-proliferative diabetic retinopathy; PDR proliferative diabetic retinopathy; CSME clinically significant macular edema; DME diabetic macular edema; dbh dot blot hemorrhages; CWS cotton wool spot; POAG primary open angle glaucoma; C/D cup-to-disc ratio; HVF humphrey visual field; GVF goldmann visual field; OCT optical coherence tomography; IOP intraocular pressure; BRVO Branch retinal vein occlusion; CRVO central retinal vein  occlusion; CRAO central retinal artery occlusion; BRAO branch retinal artery occlusion; RT retinal tear; SB scleral buckle; PPV pars plana vitrectomy; VH Vitreous hemorrhage; PRP panretinal laser photocoagulation; IVK intravitreal kenalog; VMT vitreomacular traction; MH Macular hole;  NVD neovascularization of the disc; NVE neovascularization elsewhere; AREDS age related eye disease study; ARMD age related macular degeneration; POAG primary open angle glaucoma; EBMD epithelial/anterior basement membrane dystrophy; ACIOL anterior chamber intraocular lens; IOL intraocular lens; PCIOL posterior chamber intraocular lens; Phaco/IOL phacoemulsification with intraocular lens placement; [place]  photorefractive keratectomy; LASIK laser assisted in situ keratomileusis; HTN hypertension; DM diabetes mellitus; COPD chronic obstructive pulmonary disease

## 2021-07-17 ENCOUNTER — Ambulatory Visit (INDEPENDENT_AMBULATORY_CARE_PROVIDER_SITE_OTHER): Payer: Medicaid Other | Admitting: Ophthalmology

## 2021-07-17 ENCOUNTER — Encounter (INDEPENDENT_AMBULATORY_CARE_PROVIDER_SITE_OTHER): Payer: Self-pay | Admitting: Ophthalmology

## 2021-07-17 DIAGNOSIS — H25813 Combined forms of age-related cataract, bilateral: Secondary | ICD-10-CM

## 2021-07-17 DIAGNOSIS — E113513 Type 2 diabetes mellitus with proliferative diabetic retinopathy with macular edema, bilateral: Secondary | ICD-10-CM | POA: Diagnosis not present

## 2021-07-17 DIAGNOSIS — H35372 Puckering of macula, left eye: Secondary | ICD-10-CM

## 2021-07-17 DIAGNOSIS — H35033 Hypertensive retinopathy, bilateral: Secondary | ICD-10-CM

## 2021-07-17 DIAGNOSIS — I10 Essential (primary) hypertension: Secondary | ICD-10-CM

## 2021-07-17 DIAGNOSIS — H40051 Ocular hypertension, right eye: Secondary | ICD-10-CM

## 2021-07-18 NOTE — Progress Notes (Deleted)
Office Visit    Patient Name: Andrew Chan Date of Encounter: 07/18/2021  Primary Care Provider:  [facility], [place] Primary Cardiologist:  None  Chief Complaint    66 year old male with a  history of chronic systolic heart failure, cardiomyopathy (mixed etiology: Ischemic and nonischemic), s/p ICD (Medtronic ever MRI implanted 2018), CAD s/p PCI-RCA, hypertension, hyperlipidemia, ESRD on HD, type 2 diabetes, DVT, glaucoma, and iron deficiency anemia presents for follow-up related to CAD and heart failure.  Past Medical History    Past Medical History:  Diagnosis Date   Anemia in chronic kidney disease    Colon cancer ([place])    Diabetes mellitus without complication ([place])    DVT (deep venous thrombosis) (H[facility])    left upper extremity   End stage renal disease ([place])    Family history of colon cancer    Glaucoma    Hammertoes of both feet    Heart failure ([place])    Hypertension    Iron deficiency anemia    Mixed hyperlipidemia    Nerve damage    Nonischemic cardiomyopathy (HCC)    NSTEMI (non-ST elevated myocardial infarction) ([place])    PVD (peripheral vascular disease) ([place])    Retinal edema    Secondary hyperparathyroidism of renal origin ([place])    Senile nuclear sclerosis    Vitamin D deficiency    Past Surgical History:  Procedure Laterality Date   CARDIAC CATHETERIZATION Left 12/08/2014   Procedure: Left Heart Cath and Coronary Angiography;  Surgeon: Dionisio David, MD;  Location: [place] [facility];  Service: Cardiovascular;  Laterality: Left;   CARDIAC CATHETERIZATION N/A 12/09/2014   Procedure: Coronary Stent Intervention;  Surgeon: Yolonda Kida, MD;  Location: [place] [facility];  Service: Cardiovascular;  Laterality: N/A;   COLONOSCOPY     IR DIALY SHUNT INTRO NEEDLE/INTRACATH INITIAL W/IMG RIGHT Right 05/20/2019   IR RADIOLOGIST EVAL & MGMT  03/30/2019   IR RADIOLOGIST EVAL & MGMT  05/03/2020    Allergies  Allergies   Allergen Reactions   Advil [Ibuprofen]     Stomach problems    History of Present Illness    66 year old male with the above past medical history including chronic systolic heart failure, cardiomyopathy (mixed etiology: Ischemic and nonischemic), s/p ICD (Medtronic ever MRI implanted 2018), CAD s/p PCI-RCA, hypertension, hyperlipidemia, ESRD on HD, type 2 diabetes, DVT, glaucoma, and iron deficiency anemia.  His primary cardiologist is Dr. Ainsley Spinner, he follows with Dr. Quentin Ore for EP. He was previously followed by Dr. Karle Barr at [facility] in the heart failure clinic.  He had an echocardiogram performed in 2018 documenting EF 20% and NYHA class II-III symptoms.  ICD was implanted for primary prevention in 2018. He had a wire associated L UE DVT in November 2018, longer on anticoagulation. EF improved to 40 to 45% in 2021.  Additionally, he has a history of CAD with PCI-RCA in 2016.  He has a history of ESRD. GDMT for HFrEF has been limited in the setting of hypotension with dialysis.  His recent echocardiogram in September 2022 showed EF 45 to 50%, mildly decreased LV function, mild LVH, G1 DD, mild mitral valve regurgitation, mild to moderate aortic valve sclerosis without evidence of aortic stenosis, dilation of ascending aorta measuring 40 mm.  He was last seen by EP on 05/17/2021 noted atypical chest pain.  He underwent Lexiscan Myoview which was consistent with prior MI, no reversible ischemia, EF estimated 34%, considered high risk due  to moderate to severe reduction in LV systolic function.  Repeat echocardiogram was recommended and he was started on Imdur 15 mg daily.  He presents today for follow-up.  Since his last visit   CAD/atypical chest pain: Chronic systolic heart failure/mixed ICM/NICM: Hypertension: Hyperlipidemia: Type 2 diabetes: ESRD: Disposition:  Home Medications    Current Outpatient Medications  Medication Sig Dispense Refill   acetaminophen (TYLENOL) 500 MG tablet Take 500  mg by mouth every 6 (six) hours as needed for moderate pain or headache.     apixaban (ELIQUIS) 2.5 MG TABS tablet Take 1 tablet by mouth in the morning and at bedtime.     aspirin EC 81 MG tablet Take 81 mg by mouth daily. Swallow whole. NOT taken on dialysis days.     atorvastatin (LIPITOR) 80 MG tablet Take 80 mg by mouth daily.     AURYXIA 1 GM 210 MG(Fe) tablet Take 210 mg by mouth 3 (three) times daily.     Cholecalciferol 50 MCG (2000 UT) CAPS Take 2,000 mg by mouth daily.     dorzolamide-timolol (COSOPT) 22.3-6.8 MG/ML ophthalmic solution Place 1 drop into the right eye 2 (two) times daily. 10 mL 5   gabapentin (NEURONTIN) 300 MG capsule Take 1 capsule by mouth in the morning, at noon, and at bedtime.     insulin glargine (LANTUS) 100 UNIT/ML injection Inject 4 Units into the skin at bedtime as needed (for blood sugar over 150).     isosorbide mononitrate (IMDUR) 30 MG 24 hr tablet Take 0.5 tablets (15 mg total) by mouth daily. 45 tablet 3   sucroferric oxyhydroxide (VELPHORO) 500 MG chewable tablet Chew 500 mg by mouth See admin instructions. Take 500 mg with each meal and 500 mg with each snack     No current facility-administered medications for this visit.     Review of Systems    ***.  All other systems reviewed and are otherwise negative except as noted above.    Physical Exam    VS:  There were no vitals taken for this visit. , BMI There is no height or weight on file to calculate BMI.     GEN: Well nourished, well developed, in no acute distress. HEENT: normal. Neck: Supple, no JVD, carotid bruits, or masses. Cardiac: RRR, no murmurs, rubs, or gallops. No clubbing, cyanosis, edema.  Radials/DP/PT 2+ and equal bilaterally.  Respiratory:  Respirations regular and unlabored, clear to auscultation bilaterally. GI: Soft, nontender, nondistended, BS + x 4. MS: no deformity or atrophy. Skin: warm and dry, no rash. Neuro:  Strength and sensation are intact. Psych: Normal  affect.  Accessory Clinical Findings    ECG personally reviewed by me today - *** - no acute changes.  Lab Results  Component Value Date   WBC 5.5 12/10/2014   HGB 12.2 (L) 05/20/2019   HCT 36.0 (L) 05/20/2019   MCV 90.3 12/10/2014   PLT 304 12/10/2014   Lab Results  Component Value Date   CREATININE 5.00 (H) 05/20/2019   BUN 33 (H) 05/20/2019   NA 141 05/20/2019   K 3.7 05/20/2019   CL 94 (L) 05/20/2019   CO2 30 12/10/2014   Lab Results  Component Value Date   ALT 11 (L) 09/06/2014   AST 24 09/06/2014   ALKPHOS 58 09/06/2014   BILITOT 0.5 09/06/2014   Lab Results  Component Value Date   CHOL 198 12/10/2014   HDL 60 12/10/2014   LDLCALC 120 (H) 12/10/2014  TRIG 88 12/10/2014   CHOLHDL 3.3 12/10/2014    Lab Results  Component Value Date   HGBA1C 11.6 (H) 12/07/2014    Assessment & Plan    1.  ***   Lenna Sciara, NP 07/18/2021, 6:12 PM

## 2021-07-19 ENCOUNTER — Ambulatory Visit: Payer: Medicaid Other | Admitting: Nurse Practitioner

## 2021-07-19 DIAGNOSIS — I2510 Atherosclerotic heart disease of native coronary artery without angina pectoris: Secondary | ICD-10-CM

## 2021-07-19 DIAGNOSIS — E1122 Type 2 diabetes mellitus with diabetic chronic kidney disease: Secondary | ICD-10-CM

## 2021-07-19 DIAGNOSIS — N186 End stage renal disease: Secondary | ICD-10-CM

## 2021-07-19 DIAGNOSIS — Z95810 Presence of automatic (implantable) cardiac defibrillator: Secondary | ICD-10-CM

## 2021-07-19 DIAGNOSIS — I10 Essential (primary) hypertension: Secondary | ICD-10-CM

## 2021-07-19 DIAGNOSIS — R079 Chest pain, unspecified: Secondary | ICD-10-CM

## 2021-07-19 DIAGNOSIS — E785 Hyperlipidemia, unspecified: Secondary | ICD-10-CM

## 2021-07-30 ENCOUNTER — Other Ambulatory Visit (HOSPITAL_COMMUNITY): Payer: Medicaid Other

## 2021-08-14 ENCOUNTER — Ambulatory Visit (HOSPITAL_COMMUNITY): Payer: Medicaid Other | Attending: Cardiovascular Disease

## 2021-08-14 ENCOUNTER — Ambulatory Visit: Payer: Medicaid Other | Admitting: Physician Assistant

## 2021-08-14 DIAGNOSIS — I5022 Chronic systolic (congestive) heart failure: Secondary | ICD-10-CM | POA: Diagnosis present

## 2021-08-14 LAB — ECHOCARDIOGRAM COMPLETE
Area-P 1/2: 4.89 cm2
S' Lateral: 4.1 cm

## 2021-08-17 ENCOUNTER — Ambulatory Visit (INDEPENDENT_AMBULATORY_CARE_PROVIDER_SITE_OTHER): Payer: Medicaid Other

## 2021-08-17 DIAGNOSIS — I428 Other cardiomyopathies: Secondary | ICD-10-CM | POA: Diagnosis not present

## 2021-08-17 LAB — CUP PACEART REMOTE DEVICE CHECK
Battery Remaining Longevity: 66 mo
Battery Voltage: 2.97 V
Brady Statistic AP VP Percent: 0 %
Brady Statistic AP VS Percent: 0.03 %
Brady Statistic AS VP Percent: 0.04 %
Brady Statistic AS VS Percent: 99.93 %
Brady Statistic RA Percent Paced: 0.03 %
Brady Statistic RV Percent Paced: 0.04 %
Date Time Interrogation Session: 20230714142608
HighPow Impedance: 71 Ohm
Implantable Lead Implant Date: 20180918
Implantable Lead Implant Date: 20180918
Implantable Lead Location: 753859
Implantable Lead Location: 753860
Implantable Lead Model: 5076
Implantable Lead Model: 5076
Implantable Pulse Generator Implant Date: 20180918
Lead Channel Impedance Value: 456 Ohm
Lead Channel Impedance Value: 779 Ohm
Lead Channel Impedance Value: 836 Ohm
Lead Channel Pacing Threshold Amplitude: 0.375 V
Lead Channel Pacing Threshold Amplitude: 0.625 V
Lead Channel Pacing Threshold Pulse Width: 0.4 ms
Lead Channel Pacing Threshold Pulse Width: 0.4 ms
Lead Channel Sensing Intrinsic Amplitude: 4.875 mV
Lead Channel Sensing Intrinsic Amplitude: 4.875 mV
Lead Channel Sensing Intrinsic Amplitude: 8.25 mV
Lead Channel Sensing Intrinsic Amplitude: 8.25 mV
Lead Channel Setting Pacing Amplitude: 1.5 V
Lead Channel Setting Pacing Amplitude: 2 V
Lead Channel Setting Pacing Pulse Width: 0.4 ms
Lead Channel Setting Sensing Sensitivity: 0.3 mV

## 2021-08-30 NOTE — Progress Notes (Signed)
Remote ICD transmission.   

## 2021-09-12 NOTE — Progress Notes (Unsigned)
Office Visit    Patient Name: Andrew Chan Date of Encounter: 09/12/2021  Primary Care Provider:  [facility], [place] Primary Cardiologist:  None  Chief Complaint    66 year old male with a history of chronic systolic heart failure, cardiomyopathy (mixed etiology: Ischemic and nonischemic), s/p ICD (Medtronic ever MRI implanted 2018), CAD s/p PCI-RCA, hypertension, hyperlipidemia, ESRD on HD, type 2 diabetes, DVT, glaucoma, and iron deficiency anemia dents for follow-up related to CAD and heart failure.  Past Medical History    Past Medical History:  Diagnosis Date   Anemia in chronic kidney disease    Colon cancer ([place])    Diabetes mellitus without complication ([place])    DVT (deep venous thrombosis) (H[facility])    left upper extremity   End stage renal disease ([place])    Family history of colon cancer    Glaucoma    Hammertoes of both feet    Heart failure ([place])    Hypertension    Iron deficiency anemia    Mixed hyperlipidemia    Nerve damage    Nonischemic cardiomyopathy (H[facility])    NSTEMI (non-ST elevated myocardial infarction) ([place])    PVD (peripheral vascular disease) ([place])    Retinal edema    Secondary hyperparathyroidism of renal origin ([place])    Senile nuclear sclerosis    Vitamin D deficiency    Past Surgical History:  Procedure Laterality Date   CARDIAC CATHETERIZATION Left 12/08/2014   Procedure: Left Heart Cath and Coronary Angiography;  Surgeon: Dionisio David, MD;  Location: [place] [facility];  Service: Cardiovascular;  Laterality: Left;   CARDIAC CATHETERIZATION N/A 12/09/2014   Procedure: Coronary Stent Intervention;  Surgeon: Yolonda Kida, MD;  Location: [place] [facility];  Service: Cardiovascular;  Laterality: N/A;   COLONOSCOPY     IR DIALY SHUNT INTRO NEEDLE/INTRACATH INITIAL W/IMG RIGHT Right 05/20/2019   IR RADIOLOGIST EVAL & MGMT  03/30/2019   IR RADIOLOGIST EVAL & MGMT  05/03/2020    Allergies  Allergies  Allergen  Reactions   Advil [Ibuprofen]     Stomach problems    History of Present Illness    66 year old male with the above past medical history including chronic systolic heart failure, cardiomyopathy (mixed etiology: Ischemic and nonischemic), s/p ICD (Medtronic ever MRI implanted 2018), CAD s/p PCI-RCA, hypertension, hyperlipidemia, ESRD on HD, type 2 diabetes, DVT, glaucoma, and iron deficiency anemia.  His primary cardiologist is Dr. Ainsley Spinner, he follows with Dr. Quentin Ore for EP. He was previously followed by Dr. Karle [facility] at [facility] in the heart failure clinic.  He had an echocardiogram performed in 2018 documenting EF 20% and NYHA class II-III symptoms.  ICD was implanted for primary prevention in 2018. He had a wire associated L UE DVT in November 2018, longer on anticoagulation. EF improved to 40 to 45% in 2021.  Additionally, he has a history of CAD with PCI-RCA in 2016.  He has a history of ESRD. GDMT for HFrEF has been limited in the setting of hypotension with dialysis.  His recent echocardiogram in September 2022 showed EF 45 to 50%, mildly decreased LV function, mild LVH, G1 DD, mild mitral valve regurgitation, mild to moderate aortic valve sclerosis without evidence of aortic stenosis, dilation of ascending aorta measuring 40 mm.  He was last seen by EP on 05/17/2021 and noted atypical chest pain.  He underwent Lexiscan Myoview which was consistent with prior MI, no reversible ischemia, EF estimated 34%, considered high risk due to  moderate to severe reduction in LV systolic function.  Repeat echocardiogram 40 to 45%, LV global hypokinesis, G1 DD, elevated LVEDP, normal RV systolic function, mild mitral valve regurgitation, mild aortic valve regurgitation.  He was started on Imdur 15 mg daily.  He presents today for follow-up.  Since his last visit   CAD/atypical chest pain: Chronic systolic heart failure/mixed ICM/NICM: Hypertension: Hyperlipidemia: Type 2 diabetes: ESRD: Disposition:   Home  Medications    Current Outpatient Medications  Medication Sig Dispense Refill   acetaminophen (TYLENOL) 500 MG tablet Take 500 mg by mouth every 6 (six) hours as needed for moderate pain or headache.     apixaban (ELIQUIS) 2.5 MG TABS tablet Take 1 tablet by mouth in the morning and at bedtime.     aspirin EC 81 MG tablet Take 81 mg by mouth daily. Swallow whole. NOT taken on dialysis days.     atorvastatin (LIPITOR) 80 MG tablet Take 80 mg by mouth daily.     AURYXIA 1 GM 210 MG(Fe) tablet Take 210 mg by mouth 3 (three) times daily.     Cholecalciferol 50 MCG (2000 UT) CAPS Take 2,000 mg by mouth daily.     dorzolamide-timolol (COSOPT) 22.3-6.8 MG/ML ophthalmic solution Place 1 drop into the right eye 2 (two) times daily. 10 mL 5   gabapentin (NEURONTIN) 300 MG capsule Take 1 capsule by mouth in the morning, at noon, and at bedtime.     insulin glargine (LANTUS) 100 UNIT/ML injection Inject 4 Units into the skin at bedtime as needed (for blood sugar over 150).     isosorbide mononitrate (IMDUR) 30 MG 24 hr tablet Take 0.5 tablets (15 mg total) by mouth daily. 45 tablet 3   sucroferric oxyhydroxide (VELPHORO) 500 MG chewable tablet Chew 500 mg by mouth See admin instructions. Take 500 mg with each meal and 500 mg with each snack     No current facility-administered medications for this visit.     Review of Systems    ***.  All other systems reviewed and are otherwise negative except as noted above.    Physical Exam    VS:  There were no vitals taken for this visit. , BMI There is no height or weight on file to calculate BMI.     GEN: Well nourished, well developed, in no acute distress. HEENT: normal. Neck: Supple, no JVD, carotid bruits, or masses. Cardiac: RRR, no murmurs, rubs, or gallops. No clubbing, cyanosis, edema.  Radials/DP/PT 2+ and equal bilaterally.  Respiratory:  Respirations regular and unlabored, clear to auscultation bilaterally. GI: Soft, nontender, nondistended, BS  + x 4. MS: no deformity or atrophy. Skin: warm and dry, no rash. Neuro:  Strength and sensation are intact. Psych: Normal affect.  Accessory Clinical Findings    ECG personally reviewed by me today - *** - no acute changes.  Lab Results  Component Value Date   WBC 5.5 12/10/2014   HGB 12.2 (L) 05/20/2019   HCT 36.0 (L) 05/20/2019   MCV 90.3 12/10/2014   PLT 304 12/10/2014   Lab Results  Component Value Date   CREATININE 5.00 (H) 05/20/2019   BUN 33 (H) 05/20/2019   NA 141 05/20/2019   K 3.7 05/20/2019   CL 94 (L) 05/20/2019   CO2 30 12/10/2014   Lab Results  Component Value Date   ALT 11 (L) 09/06/2014   AST 24 09/06/2014   ALKPHOS 58 09/06/2014   BILITOT 0.5 09/06/2014   Lab Results  Component  Value Date   CHOL 198 12/10/2014   HDL 60 12/10/2014   LDLCALC 120 (H) 12/10/2014   TRIG 88 12/10/2014   CHOLHDL 3.3 12/10/2014    Lab Results  Component Value Date   HGBA1C 11.6 (H) 12/07/2014    Assessment & Plan    1.  ***   Lenna Sciara, NP 09/12/2021, 8:44 PM

## 2021-09-13 ENCOUNTER — Encounter: Payer: Self-pay | Admitting: Nurse Practitioner

## 2021-09-13 ENCOUNTER — Ambulatory Visit (INDEPENDENT_AMBULATORY_CARE_PROVIDER_SITE_OTHER): Payer: Medicaid Other | Admitting: Nurse Practitioner

## 2021-09-13 VITALS — BP 120/58 | HR 87 | Ht 68.0 in | Wt 172.2 lb

## 2021-09-13 DIAGNOSIS — I5022 Chronic systolic (congestive) heart failure: Secondary | ICD-10-CM | POA: Diagnosis not present

## 2021-09-13 DIAGNOSIS — I2510 Atherosclerotic heart disease of native coronary artery without angina pectoris: Secondary | ICD-10-CM | POA: Diagnosis not present

## 2021-09-13 DIAGNOSIS — R079 Chest pain, unspecified: Secondary | ICD-10-CM | POA: Diagnosis not present

## 2021-09-13 DIAGNOSIS — Z86718 Personal history of other venous thrombosis and embolism: Secondary | ICD-10-CM

## 2021-09-13 DIAGNOSIS — I428 Other cardiomyopathies: Secondary | ICD-10-CM

## 2021-09-13 DIAGNOSIS — Z992 Dependence on renal dialysis: Secondary | ICD-10-CM

## 2021-09-13 DIAGNOSIS — I10 Essential (primary) hypertension: Secondary | ICD-10-CM

## 2021-09-13 DIAGNOSIS — Z794 Long term (current) use of insulin: Secondary | ICD-10-CM

## 2021-09-13 DIAGNOSIS — Z95810 Presence of automatic (implantable) cardiac defibrillator: Secondary | ICD-10-CM

## 2021-09-13 DIAGNOSIS — N186 End stage renal disease: Secondary | ICD-10-CM

## 2021-09-13 DIAGNOSIS — E1122 Type 2 diabetes mellitus with diabetic chronic kidney disease: Secondary | ICD-10-CM

## 2021-09-13 DIAGNOSIS — E785 Hyperlipidemia, unspecified: Secondary | ICD-10-CM

## 2021-09-13 NOTE — Patient Instructions (Signed)
Medication Instructions:  Your physician recommends that you continue on your current medications as directed. Please refer to the Current Medication list given to you today.   *If you need a refill on your cardiac medications before your next appointment, please call your pharmacy*   Lab Work: NONE ordered at this time of appointment   If you have labs (blood work) drawn today and your tests are completely normal, you will receive your results only by: [place] (if you have MyChart) OR A paper copy in the mail If you have any lab test that is abnormal or we need to change your treatment, we will call you to review the results.   Testing/Procedures: NONE ordered at this time of appointment    Follow-Up: At [facility], you and your health needs are our priority.  As part of our continuing mission to provide you with exceptional heart care, we have created designated Provider Care Teams.  These Care Teams include your primary Cardiologist (physician) and Advanced Practice Providers (APPs -  Physician Assistants and Nurse Practitioners) who all work together to provide you with the care you need, when you need it.  We recommend signing up for the patient portal called "MyChart".  Sign up information is provided on this After Visit Summary.  MyChart is used to connect with patients for Virtual Visits (Telemedicine).  Patients are able to view lab/test results, encounter notes, upcoming appointments, etc.  Non-urgent messages can be sent to your provider as well.   To learn more about what you can do with MyChart, go to [email or web address].    Your next appointment:   6 month(s)  The format for your next appointment:   In Person  Provider:   Elouise Munroe, MD     Other Instructions   Important Information About Sugar

## 2021-10-04 NOTE — Progress Notes (Signed)
[facility] Note  10/16/2021     CHIEF COMPLAINT Patient presents for Retina Follow Up  HISTORY OF PRESENT ILLNESS: Andrew Chan is a 66 y.o. male who presents to the clinic today for:   HPI     Retina Follow Up   Patient presents with  Diabetic Retinopathy.  In both eyes.  Severity is moderate.  Duration of 3 months.  Since onset it is stable.  I, the attending physician,  performed the HPI with the patient and updated documentation appropriately.        Comments   Patient states that the vision is blurry OS>OD. He states that he is using the drop that he was told to. His blood sugar is 145 and he is unsure of his A1C.       Last edited by Bernarda Caffey, MD on 10/16/2021 11:31 PM.     Referring physician: Inc, [address],  [place] [address]  HISTORICAL INFORMATION:   Selected notes from the MEDICAL RECORD NUMBER Referred by [facility] for eval of diabetic retinopathy   CURRENT MEDICATIONS: Current Outpatient Medications (Ophthalmic Drugs)  Medication Sig   dorzolamide-timolol (COSOPT) 22.3-6.8 MG/ML ophthalmic solution Place 1 drop into the right eye 2 (two) times daily.   No current facility-administered medications for this visit. (Ophthalmic Drugs)   Current Outpatient Medications (Other)  Medication Sig   acetaminophen (TYLENOL) 500 MG tablet Take 500 mg by mouth every 6 (six) hours as needed for moderate pain or headache.   apixaban (ELIQUIS) 2.5 MG TABS tablet Take 1 tablet by mouth in the morning and at bedtime.   aspirin EC 81 MG tablet Take 81 mg by mouth daily. Swallow whole. NOT taken on dialysis days.   atorvastatin (LIPITOR) 80 MG tablet Take 80 mg by mouth daily.   AURYXIA 1 GM 210 MG(Fe) tablet Take 210 mg by mouth 3 (three) times daily.   Cholecalciferol 50 MCG (2000 UT) CAPS Take 2,000 mg by mouth daily.   gabapentin (NEURONTIN) 300 MG capsule Take 1 capsule by  mouth in the morning, at noon, and at bedtime.   insulin glargine (LANTUS) 100 UNIT/ML injection Inject 4 Units into the skin at bedtime as needed (for blood sugar over 150).   isosorbide mononitrate (IMDUR) 30 MG 24 hr tablet Take 0.5 tablets (15 mg total) by mouth daily.   sucroferric oxyhydroxide (VELPHORO) 500 MG chewable tablet Chew 500 mg by mouth See admin instructions. Take 500 mg with each meal and 500 mg with each snack   No current facility-administered medications for this visit. (Other)   REVIEW OF SYSTEMS: ROS   Positive for: Genitourinary, Endocrine, Cardiovascular Negative for: Constitutional, Gastrointestinal, Neurological, Skin, Musculoskeletal, HENT, Eyes, Respiratory, Psychiatric, Allergic/Imm, Heme/Lymph Last edited by Annie Paras, COT on 10/16/2021 10:07 AM.     ALLERGIES Allergies  Allergen Reactions   Advil [Ibuprofen]     Stomach problems   PAST MEDICAL HISTORY Past Medical History:  Diagnosis Date   Anemia in chronic kidney disease    Colon cancer ([place])    Diabetes mellitus without complication ([place])    DVT (deep venous thrombosis) (HCC)    left upper extremity   End stage renal disease ([place])    Family history of colon cancer    Glaucoma    Hammertoes of both feet    Heart failure ([place])    Hypertension    Iron deficiency  anemia    Mixed hyperlipidemia    Nerve damage    Nonischemic cardiomyopathy (HCC)    NSTEMI (non-ST elevated myocardial infarction) ([place])    PVD (peripheral vascular disease) ([place])    Retinal edema    Secondary hyperparathyroidism of renal origin ([place])    Senile nuclear sclerosis    Vitamin D deficiency    Past Surgical History:  Procedure Laterality Date   CARDIAC CATHETERIZATION Left 12/08/2014   Procedure: Left Heart Cath and Coronary Angiography;  Surgeon: Dionisio David, MD;  Location: [facility];  Service: Cardiovascular;  Laterality: Left;   CARDIAC CATHETERIZATION N/A 12/09/2014   Procedure:  Coronary Stent Intervention;  Surgeon: Yolonda Kida, MD;  Location: [facility];  Service: Cardiovascular;  Laterality: N/A;   COLONOSCOPY     IR DIALY SHUNT INTRO NEEDLE/INTRACATH INITIAL W/IMG RIGHT Right 05/20/2019   IR RADIOLOGIST EVAL & MGMT  03/30/2019   IR RADIOLOGIST EVAL & MGMT  05/03/2020   FAMILY HISTORY Family History  Problem Relation Age of Onset   CAD Mother    Heart failure Mother    Kidney failure Father    Diabetes Mellitus II Sister    Heart disease Sister    Colon cancer Sister    CAD Brother    Colon cancer Brother    Colon polyps Brother    Colon cancer Brother    Seizures Son    Other Daughter        died at birth   SOCIAL HISTORY Social History   Tobacco Use   Smoking status: Never   Smokeless tobacco: Never  Vaping Use   Vaping Use: Never used  Substance Use Topics   Alcohol use: Not Currently   Drug use: Yes    Types: Cocaine       OPHTHALMIC EXAM:  Base Eye Exam     Visual Acuity (Snellen - Linear)       Right Left   Dist cc 20/40 20/80   Dist ph cc NI NI    Correction: Glasses         Tonometry (Tonopen, 10:13 AM)       Right Left   Pressure 21 19         Pupils       Dark Light Shape React APD   Right 2 1 Round Minimal None   Left 2 1 Round Minimal None         Visual Fields       Left Right    Full Full         Extraocular Movement       Right Left    Full, Ortho Full, Ortho         Neuro/Psych     Oriented x3: Yes   Mood/Affect: Normal         Dilation     Both eyes: 1.0% Mydriacyl, 2.5% Phenylephrine @ 10:09 AM           Slit Lamp and Fundus Exam     Slit Lamp Exam       Right Left   Lids/Lashes Dermato, mild MGD Dermato, mild MGD   Conjunctiva/Sclera Melanosis Mild melanosis   Cornea Mild arcus, trace PEE, mild tear film debris Mild arcus, trace PEE, mild tear film debris   Anterior Chamber deep, clear, narrow temporal angle deep, clear, narrow temporal angle    Iris Round and dilated, mild anterior bowing, No NVI Round and dilated, mild  anterior bowing, No NVI   Lens 2-3+ NS with mild brunescence, 2-3+ cortical, mild PSC 2-3+ NS with brunescence, 2-3+ Cortical   Anterior Vitreous Synerisis Synerisis; vit condensations / old VH -- settling inferiorly, improving         Fundus Exam       Right Left   Disc Mild pallor, sharp rim Mild pallor, sharp rim   C/D Ratio 0.5 0.4   Macula Flat, blunted foveal reflex, scattered MA and focal exudates greatest temporal macula -- persistent Blunted foveal reflex, ERM, tractional fibrosis along ST arcades, scattered MA   Vessels mild attenuation, mild tortuosity, mild copper wiring Attenuated, tortuous, +fibrosis along ST arcades   Periphery Attached, scattered MA/DBH, scattered PRP laser changes, room for fill in Attached, PRP laser changes 360 w good fill-in, scattered MA           Refraction     Wearing Rx       Sphere Cylinder Axis Add   Right -0.25 +0.75 014 +2.25   Left +0.50 +1.25 162 +2.25           IMAGING AND PROCEDURES  Imaging and Procedures for 10/16/2021  OCT, Retina - OU - Both Eyes       Right Eye Quality was borderline. Central Foveal Thickness: 216. Progression has been stable. Findings include normal foveal contour, no IRF, no SRF, intraretinal hyper-reflective material (Focal IRHM and cystic changes temporal macula, diffuse retinal thinning.).   Left Eye Quality was good. Central Foveal Thickness: 349. Progression has been stable. Findings include no SRF, abnormal foveal contour, intraretinal hyper-reflective material, epiretinal membrane, intraretinal fluid, macular pucker (ERM w/ central thickening and pucker, persistent cystic changes superior macula, significant tractional edema along ST arcades caught on widefield - stable).   Notes *Images captured and stored on drive  Diagnosis / Impression:  OD: Focal IRHM and cystic changes temporal macula, diffuse retinal  thinning. OS: ERM w/ central thickening and pucker, persistent cystic changes superior macula, significant tractional edema along ST arcades caught on widefield - stable  Clinical management:  See below  Abbreviations: NFP - Normal foveal profile. CME - cystoid macular edema. PED - pigment epithelial detachment. IRF - intraretinal fluid. SRF - subretinal fluid. EZ - ellipsoid zone. ERM - epiretinal membrane. ORA - outer retinal atrophy. ORT - outer retinal tubulation. SRHM - subretinal hyper-reflective material. IRHM - intraretinal hyper-reflective material            ASSESSMENT/PLAN:    ICD-10-CM   1. Proliferative diabetic retinopathy of both eyes with macular edema associated with type 2 diabetes mellitus ([place])  E11.3513 OCT, Retina - OU - Both Eyes    2. Epiretinal membrane (ERM) of left eye  H35.372     3. Essential hypertension  I10     4. Hypertensive retinopathy of both eyes  H35.033     5. Combined form of age-related cataract, both eyes  H25.813     6. Ocular hypertension of right eye  H40.051      1. Proliferative diabetic retinopathy w/ DME, OU (OS > OD)  - A1c 7.4 on 11.1.22  - s/p PRP OS (unknown previous retina provider) -- room for fill in  - s/p PRP OS (01.31.23)  - s/p PRP OD (02.21.23) - *laser limited by dense cataract*)  - s/p IVA OD #1 (03.02.23) - exam shows scattered MA/DBH - FA (1.10.2023) shows late-leaking MA, vascular nonperfusion, +NVE OU (OS > OD) -- will benefit from PRP OU - OCT OD:  Focal IRHM and cystic changes temporal macula, diffuse retinal thinning; OS: ERM w/central thickening and pucker, persistent cystic changes superior macula, significant tractional edema along ST arcades caught on widefield - stable - discussed findings and prognosis - recommend holding treatment at this time - pt agrees - monitor - f/u in 4 months DFE, OCT  2. Epiretinal membrane, OS - ERM superior to disc w/ traction - BCVA 20/80 -- decreased -  monitor  3,4. Hypertensive retinopathy OU - discussed importance of tight BP control - monitor  5. Mixed Cataract OU - The symptoms of cataract, surgical options, and treatments and risks were discussed with patient. - discussed diagnosis and progression - cortical cataract is preventing adequate laser PRP uptake  - recommend cataract eval/possible surgery to improve monitoring and management of PDR OU - pt missed appt, will reschedule him with Dr. Katy Fitch for cataract evaluation   6. Ocular Hypertension OD  - IOP 21 today -- improved on Cosopt - cont Cosopt BID OD   Ophthalmic Meds Ordered this visit:  No orders of the defined types were placed in this encounter.    Return in about 4 months (around 02/15/2022) for f/u PDR OU, DFE, OCT.  There are no Patient Instructions on file for this visit.  Explained the diagnoses, plan, and follow up with the patient and they expressed understanding.  Patient expressed understanding of the importance of proper follow up care.   This document serves as a record of services personally performed by Gardiner Sleeper, MD, PhD. It was created on their behalf by Renaldo Reel, [place] an ophthalmic technician. The creation of this record is the provider's dictation and/or activities during the visit.    Electronically signed by:  Renaldo Reel, COT  10/04/21 11:36 PM  This document serves as a record of services personally performed by Gardiner Sleeper, MD, PhD. It was created on their behalf by San Jetty. Owens Shark, OA an ophthalmic technician. The creation of this record is the provider's dictation and/or activities during the visit.    Electronically signed by: San Jetty. Marguerita Merles 09.12.2023 11:36 PM  Gardiner Sleeper, M.D., Ph.D. Diseases & Surgery of the Retina and Vitreous [facility]  I have reviewed the above documentation for accuracy and completeness, and I agree with the above. Gardiner Sleeper, M.D., Ph.D. 10/16/21  11:39 PM   Abbreviations: M myopia (nearsighted); A astigmatism; H hyperopia (farsighted); P presbyopia; Mrx spectacle prescription;  CTL contact lenses; OD right eye; OS left eye; OU both eyes  XT exotropia; ET esotropia; PEK punctate epithelial keratitis; PEE punctate epithelial erosions; DES dry eye syndrome; MGD meibomian gland dysfunction; ATs artificial tears; PFAT's preservative free artificial tears; [place] nuclear sclerotic cataract; PSC posterior subcapsular cataract; ERM epi-retinal membrane; PVD posterior vitreous detachment; RD retinal detachment; DM diabetes mellitus; DR diabetic retinopathy; NPDR non-proliferative diabetic retinopathy; PDR proliferative diabetic retinopathy; CSME clinically significant macular edema; DME diabetic macular edema; dbh dot blot hemorrhages; CWS cotton wool spot; POAG primary open angle glaucoma; C/D cup-to-disc ratio; HVF humphrey visual field; GVF goldmann visual field; OCT optical coherence tomography; IOP intraocular pressure; BRVO Branch retinal vein occlusion; CRVO central retinal vein occlusion; CRAO central retinal artery occlusion; BRAO branch retinal artery occlusion; RT retinal tear; SB scleral buckle; PPV pars plana vitrectomy; VH Vitreous hemorrhage; PRP panretinal laser photocoagulation; IVK intravitreal kenalog; VMT vitreomacular traction; MH Macular hole;  NVD neovascularization of the disc; NVE neovascularization elsewhere; AREDS age related eye disease study; ARMD age  related macular degeneration; POAG primary open angle glaucoma; EBMD epithelial/anterior basement membrane dystrophy; ACIOL anterior chamber intraocular lens; IOL intraocular lens; PCIOL posterior chamber intraocular lens; Phaco/IOL phacoemulsification with intraocular lens placement; [place] photorefractive keratectomy; LASIK laser assisted in situ keratomileusis; HTN hypertension; DM diabetes mellitus; COPD chronic obstructive pulmonary disease

## 2021-10-16 ENCOUNTER — Ambulatory Visit (INDEPENDENT_AMBULATORY_CARE_PROVIDER_SITE_OTHER): Payer: Medicaid Other | Admitting: Ophthalmology

## 2021-10-16 ENCOUNTER — Encounter (INDEPENDENT_AMBULATORY_CARE_PROVIDER_SITE_OTHER): Payer: Self-pay | Admitting: Ophthalmology

## 2021-10-16 DIAGNOSIS — H35033 Hypertensive retinopathy, bilateral: Secondary | ICD-10-CM | POA: Diagnosis not present

## 2021-10-16 DIAGNOSIS — H35372 Puckering of macula, left eye: Secondary | ICD-10-CM | POA: Diagnosis not present

## 2021-10-16 DIAGNOSIS — I10 Essential (primary) hypertension: Secondary | ICD-10-CM | POA: Diagnosis not present

## 2021-10-16 DIAGNOSIS — H40051 Ocular hypertension, right eye: Secondary | ICD-10-CM

## 2021-10-16 DIAGNOSIS — H25813 Combined forms of age-related cataract, bilateral: Secondary | ICD-10-CM

## 2021-10-16 DIAGNOSIS — E113513 Type 2 diabetes mellitus with proliferative diabetic retinopathy with macular edema, bilateral: Secondary | ICD-10-CM | POA: Diagnosis not present

## 2021-11-16 ENCOUNTER — Ambulatory Visit (INDEPENDENT_AMBULATORY_CARE_PROVIDER_SITE_OTHER): Payer: Medicaid Other

## 2021-11-16 DIAGNOSIS — I428 Other cardiomyopathies: Secondary | ICD-10-CM

## 2021-11-19 LAB — CUP PACEART REMOTE DEVICE CHECK
Battery Remaining Longevity: 61 mo
Battery Voltage: 2.98 V
Brady Statistic AP VP Percent: 0 %
Brady Statistic AP VS Percent: 0.06 %
Brady Statistic AS VP Percent: 0.03 %
Brady Statistic AS VS Percent: 99.9 %
Brady Statistic RA Percent Paced: 0.07 %
Brady Statistic RV Percent Paced: 0.03 %
Date Time Interrogation Session: 20231013202705
HighPow Impedance: 62 Ohm
Implantable Lead Implant Date: 20180918
Implantable Lead Implant Date: 20180918
Implantable Lead Location: 753859
Implantable Lead Location: 753860
Implantable Lead Model: 5076
Implantable Lead Model: 5076
Implantable Pulse Generator Implant Date: 20180918
Lead Channel Impedance Value: 418 Ohm
Lead Channel Impedance Value: 703 Ohm
Lead Channel Impedance Value: 779 Ohm
Lead Channel Pacing Threshold Amplitude: 0.5 V
Lead Channel Pacing Threshold Amplitude: 0.5 V
Lead Channel Pacing Threshold Pulse Width: 0.4 ms
Lead Channel Pacing Threshold Pulse Width: 0.4 ms
Lead Channel Sensing Intrinsic Amplitude: 5.25 mV
Lead Channel Sensing Intrinsic Amplitude: 5.25 mV
Lead Channel Sensing Intrinsic Amplitude: 9.5 mV
Lead Channel Sensing Intrinsic Amplitude: 9.5 mV
Lead Channel Setting Pacing Amplitude: 1.5 V
Lead Channel Setting Pacing Amplitude: 2 V
Lead Channel Setting Pacing Pulse Width: 0.4 ms
Lead Channel Setting Sensing Sensitivity: 0.3 mV

## 2021-11-20 ENCOUNTER — Other Ambulatory Visit: Payer: Self-pay | Admitting: Interventional Radiology

## 2021-11-20 DIAGNOSIS — I739 Peripheral vascular disease, unspecified: Secondary | ICD-10-CM

## 2021-11-21 NOTE — Progress Notes (Signed)
Remote ICD transmission.   

## 2021-11-26 ENCOUNTER — Other Ambulatory Visit: Payer: Self-pay | Admitting: Interventional Radiology

## 2021-11-26 DIAGNOSIS — I739 Peripheral vascular disease, unspecified: Secondary | ICD-10-CM

## 2021-12-06 ENCOUNTER — Inpatient Hospital Stay: Admission: RE | Admit: 2021-12-06 | Payer: Medicaid Other | Source: Ambulatory Visit

## 2021-12-17 ENCOUNTER — Encounter: Payer: Self-pay | Admitting: Lab

## 2021-12-17 ENCOUNTER — Ambulatory Visit
Admission: RE | Admit: 2021-12-17 | Discharge: 2021-12-17 | Disposition: A | Discharge status: Home / Self Care | Payer: Medicaid Other | Source: Ambulatory Visit | Attending: Interventional Radiology | Admitting: Interventional Radiology

## 2021-12-17 DIAGNOSIS — I739 Peripheral vascular disease, unspecified: Secondary | ICD-10-CM

## 2021-12-17 HISTORY — PX: IR RADIOLOGIST EVAL & MGMT: IMG5224

## 2021-12-17 NOTE — Progress Notes (Signed)
Chief Complaint: PAD  Referring Physician(s): Uriah Trueba  History of Present Illness: Andrew Chan is a 66 y.o. male presenting today as scheduled follow up to [facility], for known PAD.    Andrew Chan joins [place] today by virtual visit. We confirmed his identity with 2 personal identifiers.    Hx:  Previously, Andrew Chan has had complaints of pain in his legs with ambulation.  However, previously, he says that his leg pain actually gets a lot better when he is exercising, which includes walking around the neighborhood and using his BowFlex machine.    CV risk factors:  DM, ESRD, known CAD, HTN.    Non invasive exam performed previously: 05/31/21 Right ABI: non-compressible Left ABI: non-compressible Waveforms are multi-phasic at the ankles.  PVR maintained.  Non-invasive exam 04/27/20 Right TBI: 0.75 Left TBI: 0.73   Segmental exam shows evidence of PT disease bilateral.  Triphasic AT bilateral.   Interval History:  Andrew Chan tells me today that he has been doing fine since last visit.  He denies any wounds on his feet/legs.  He tells me "they check them every time I'm in dialysis".  I cannot elicit any history of resting pain.  Today he tells me that he ambulates without pain, and that he is "doing ok".   He continues on medical therapy.    He was set up for repeat ABI this past October, but it seems he did not schedule this.    Prior ABI was non compressible, with waveforms maintained at the ankles.    No interval hospitalizations.   Past Medical History:  Diagnosis Date   Anemia in chronic kidney disease    Colon cancer ([place])    Diabetes mellitus without complication ([place])    DVT (deep venous thrombosis) (HCC)    left upper extremity   End stage renal disease ([place])    Family history of colon cancer    Glaucoma    Hammertoes of both feet    Heart failure ([place])    Hypertension    Iron deficiency anemia    Mixed hyperlipidemia    Nerve damage    Nonischemic  cardiomyopathy (HCC)    NSTEMI (non-ST elevated myocardial infarction) ([place])    PVD (peripheral vascular disease) ([place])    Retinal edema    Secondary hyperparathyroidism of renal origin ([place])    Senile nuclear sclerosis    Vitamin D deficiency     Past Surgical History:  Procedure Laterality Date   CARDIAC CATHETERIZATION Left 12/08/2014   Procedure: Left Heart Cath and Coronary Angiography;  Surgeon: Dionisio David, MD;  Location: [facility];  Service: Cardiovascular;  Laterality: Left;   CARDIAC CATHETERIZATION N/A 12/09/2014   Procedure: Coronary Stent Intervention;  Surgeon: Yolonda Kida, MD;  Location: [facility];  Service: Cardiovascular;  Laterality: N/A;   COLONOSCOPY     IR DIALY SHUNT INTRO NEEDLE/INTRACATH INITIAL W/IMG RIGHT Right 05/20/2019   IR RADIOLOGIST EVAL & MGMT  03/30/2019   IR RADIOLOGIST EVAL & MGMT  05/03/2020    Allergies: Advil [ibuprofen]  Medications: Prior to Admission medications   Medication Sig Start Date End Date Taking? Authorizing Provider  acetaminophen (TYLENOL) 500 MG tablet Take 500 mg by mouth every 6 (six) hours as needed for moderate pain or headache.    [provider]  apixaban (ELIQUIS) 2.5 MG TABS tablet Take 1 tablet by mouth in the morning and at bedtime.    [provider]  aspirin EC 81 MG tablet Take 81 mg by mouth daily. Swallow whole. NOT taken on dialysis days.    [provider]  atorvastatin (LIPITOR) 80 MG tablet Take 80 mg by mouth daily.    [provider]  AURYXIA 1 GM 210 MG(Fe) tablet Take 210 mg by mouth 3 (three) times daily. 02/11/20   [provider]  Cholecalciferol 50 MCG (2000 UT) CAPS Take 2,000 mg by mouth daily.    [provider]  dorzolamide-timolol (COSOPT) 22.3-6.8 MG/ML ophthalmic solution Place 1 drop into the right eye 2 (two) times daily. 03/27/21 03/27/22  Bernarda Caffey, MD  gabapentin (NEURONTIN) 300 MG capsule Take 1 capsule by  mouth in the morning, at noon, and at bedtime.    [provider]  insulin glargine (LANTUS) 100 UNIT/ML injection Inject 4 Units into the skin at bedtime as needed (for blood sugar over 150).    [provider]  isosorbide mononitrate (IMDUR) 30 MG 24 hr tablet Take 0.5 tablets (15 mg total) by mouth daily. 06/25/21   Shirley Friar, PA-C  sucroferric oxyhydroxide (VELPHORO) 500 MG chewable tablet Chew 500 mg by mouth See admin instructions. Take 500 mg with each meal and 500 mg with each snack    [provider]     Family History  Problem Relation Age of Onset   CAD Mother    Heart failure Mother    Kidney failure Father    Diabetes Mellitus II Sister    Heart disease Sister    Colon cancer Sister    CAD Brother    Colon cancer Brother    Colon polyps Brother    Colon cancer Brother    Seizures Son    Other Daughter        died at birth    Social History   Socioeconomic History   Marital status: Divorced    Spouse name: Not on file   Number of children: 1   Years of education: Not on file   Highest education level: Not on file  Occupational History   Occupation: disable  Tobacco Use   Smoking status: Never   Smokeless tobacco: Never  Vaping Use   Vaping Use: Never used  Substance and Sexual Activity   Alcohol use: Not Currently   Drug use: Yes    Types: Cocaine   Sexual activity: Not Currently  Other Topics Concern   Not on file  Social History Narrative   He is divorced has 1 son he is disabled   Dialyzes Monday Wednesday Friday   Denies alcohol now but there is a history of that and cocaine use never smoker   Social Determinants of Radio broadcast assistant Strain: Not on file  Food Insecurity: Not on file  Transportation Needs: Not on file  Physical Activity: Not on file  Stress: Not on file  Social Connections: Not on file       Review of Systems  Review of Systems: A 12 point ROS discussed and pertinent  positives are indicated in the HPI above.  All other systems are negative.    Physical Exam No direct physical exam was performed (except for noted visual exam findings with Video Visits).    Vital Signs: There were no vitals taken for this visit.  Imaging: No results found.  Labs:  CBC: No results for input(s): "WBC", "HGB", "HCT", "PLT" in the last 8760 hours.  COAGS: No results for input(s): "INR", "APTT" in the last  8760 hours.  BMP: No results for input(s): "NA", "K", "CL", "CO2", "GLUCOSE", "BUN", "CALCIUM", "CREATININE", "GFRNONAA", "GFRAA" in the last 8760 hours.  Invalid input(s): "CMP"  LIVER FUNCTION TESTS: No results for input(s): "BILITOT", "AST", "ALT", "ALKPHOS", "PROT", "ALBUMIN" in the last 8760 hours.  TUMOR MARKERS: No results for input(s): "AFPTM", "CEA", "CA199", "CHROMGRNA" in the last 8760 hours.  Assessment:  Andrew Chan is a 66 yo male presenting with follow up of known lower extremity PAD.    He remains asymptomatic, so [place] 0 class PAD.   Today he tells me his leg pain is improved since the last we visited.    Continued medical management is for reduction of risk factors is indicated as recommended by updated AHA guidelines1.  This includes anti-platelet medication, tight blood glucose control to a HbA1c < 7, tight blood pressure control, maximum-dose HMG-CoA reductase inhibitor, and smoking cessation.     At this point annual surveillance is indicated based on his history.   During our visit, he indicated that he prefers not to schedule a follow up at this time, and that he would prefer only to call us if he has symptoms.  I did let him know that we are happy to see him at any time should he develop symptoms.  He understands.    Plan: -  He prefers to discontinue surveillance appointment at this time, and will call us if he needs [place].  - Continued maximal medical therapy for cardiovascular risk reduction, including anti-platelet  therapy. -Observe healthy foot care habits, including his frequent checks at HD.      Electronically Signed: Corrie Mckusick 12/17/2021, 10:45 AM   I spent a total of    15 Minutes in remote  clinical consultation, greater than 50% of which was counseling/coordinating care for peripheral artery disease, surveillance.    Visit type: Audio only (telephone). Audio (no video) only due to patient's lack of internet/smartphone capability. Alternative for in-person consultation at [facility], [address] [place], [place], [place]. This visit type was conducted due to national recommendations for restrictions regarding the COVID-19 Pandemic (e.g. social distancing).  This format is felt to be most appropriate for this patient at this time.  All issues noted in this document were discussed and addressed.

## 2022-02-12 IMAGING — DX DG FOOT COMPLETE 3+V*R*
3 series · 3 of 3 positions shown · non-contrast
Comparison: None.

CLINICAL DATA: Crushing injury of right fifth toe.

EXAM:
RIGHT FOOT COMPLETE - 3+ VIEW

[dg foot complete right (1 of 3)]
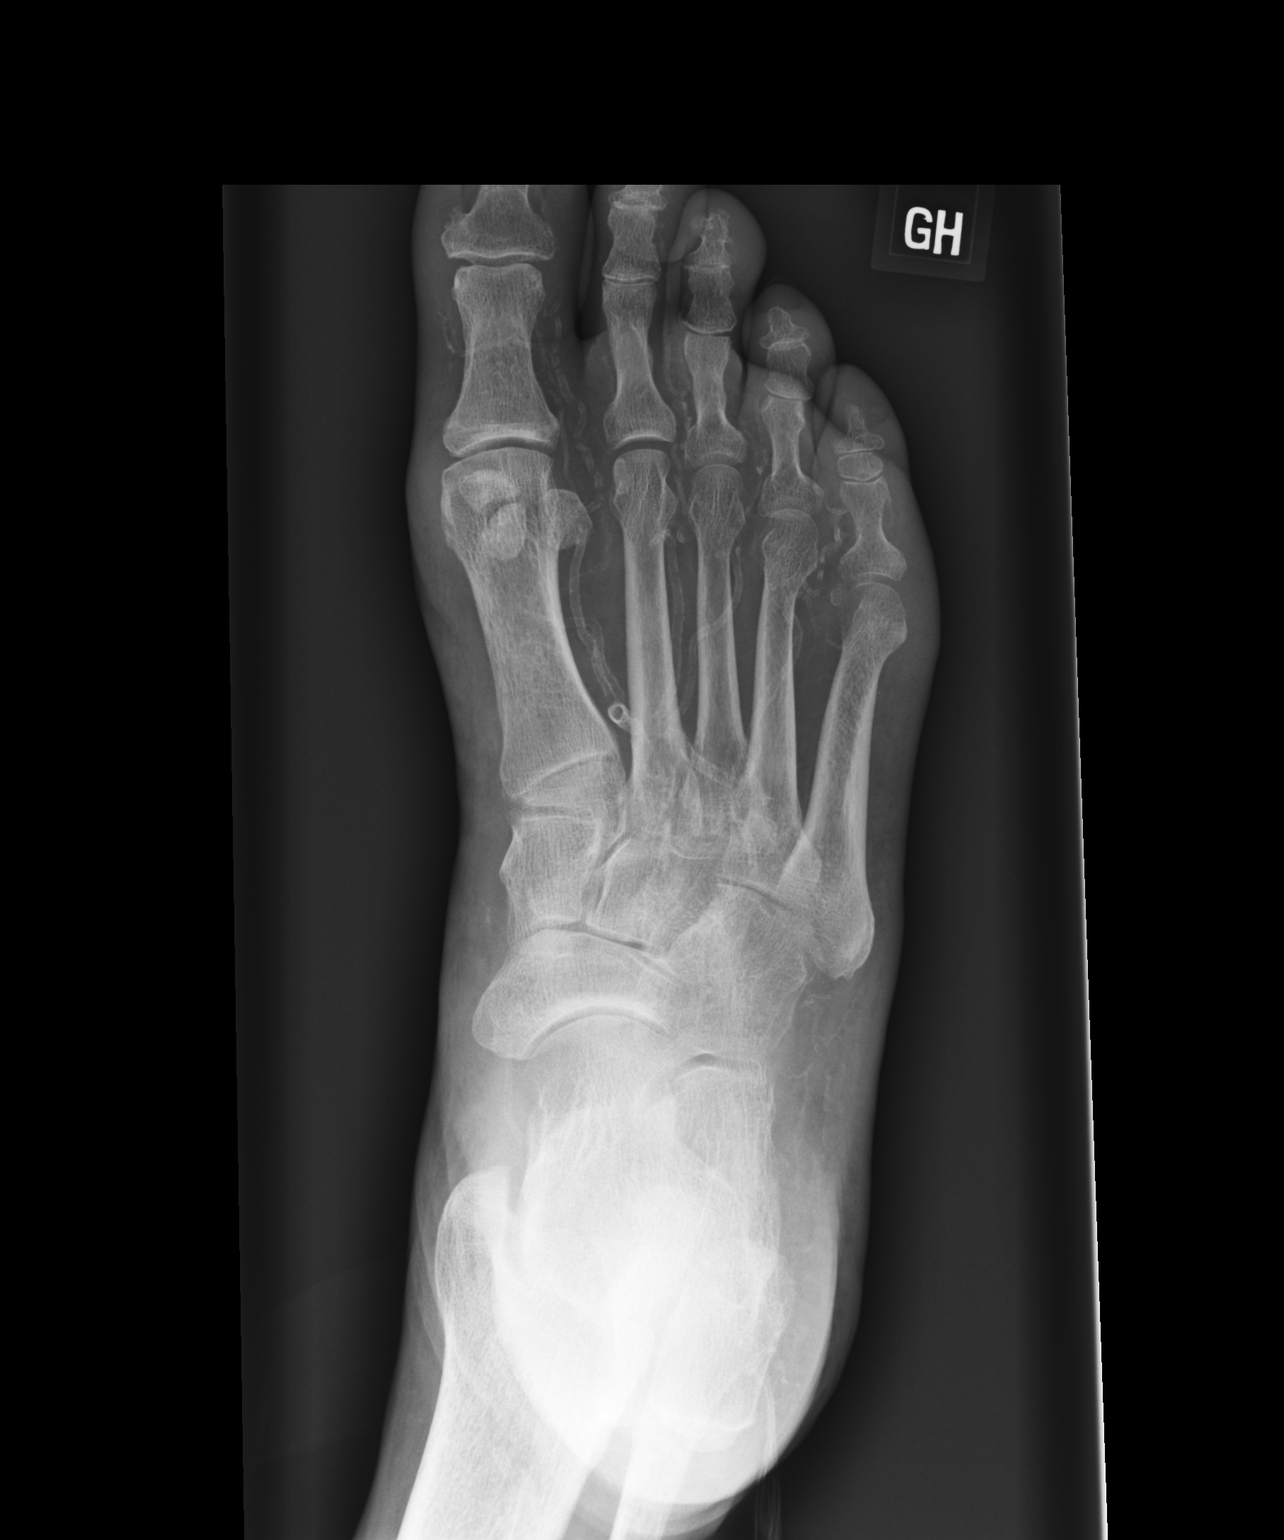

[dg foot complete right (2 of 3)]
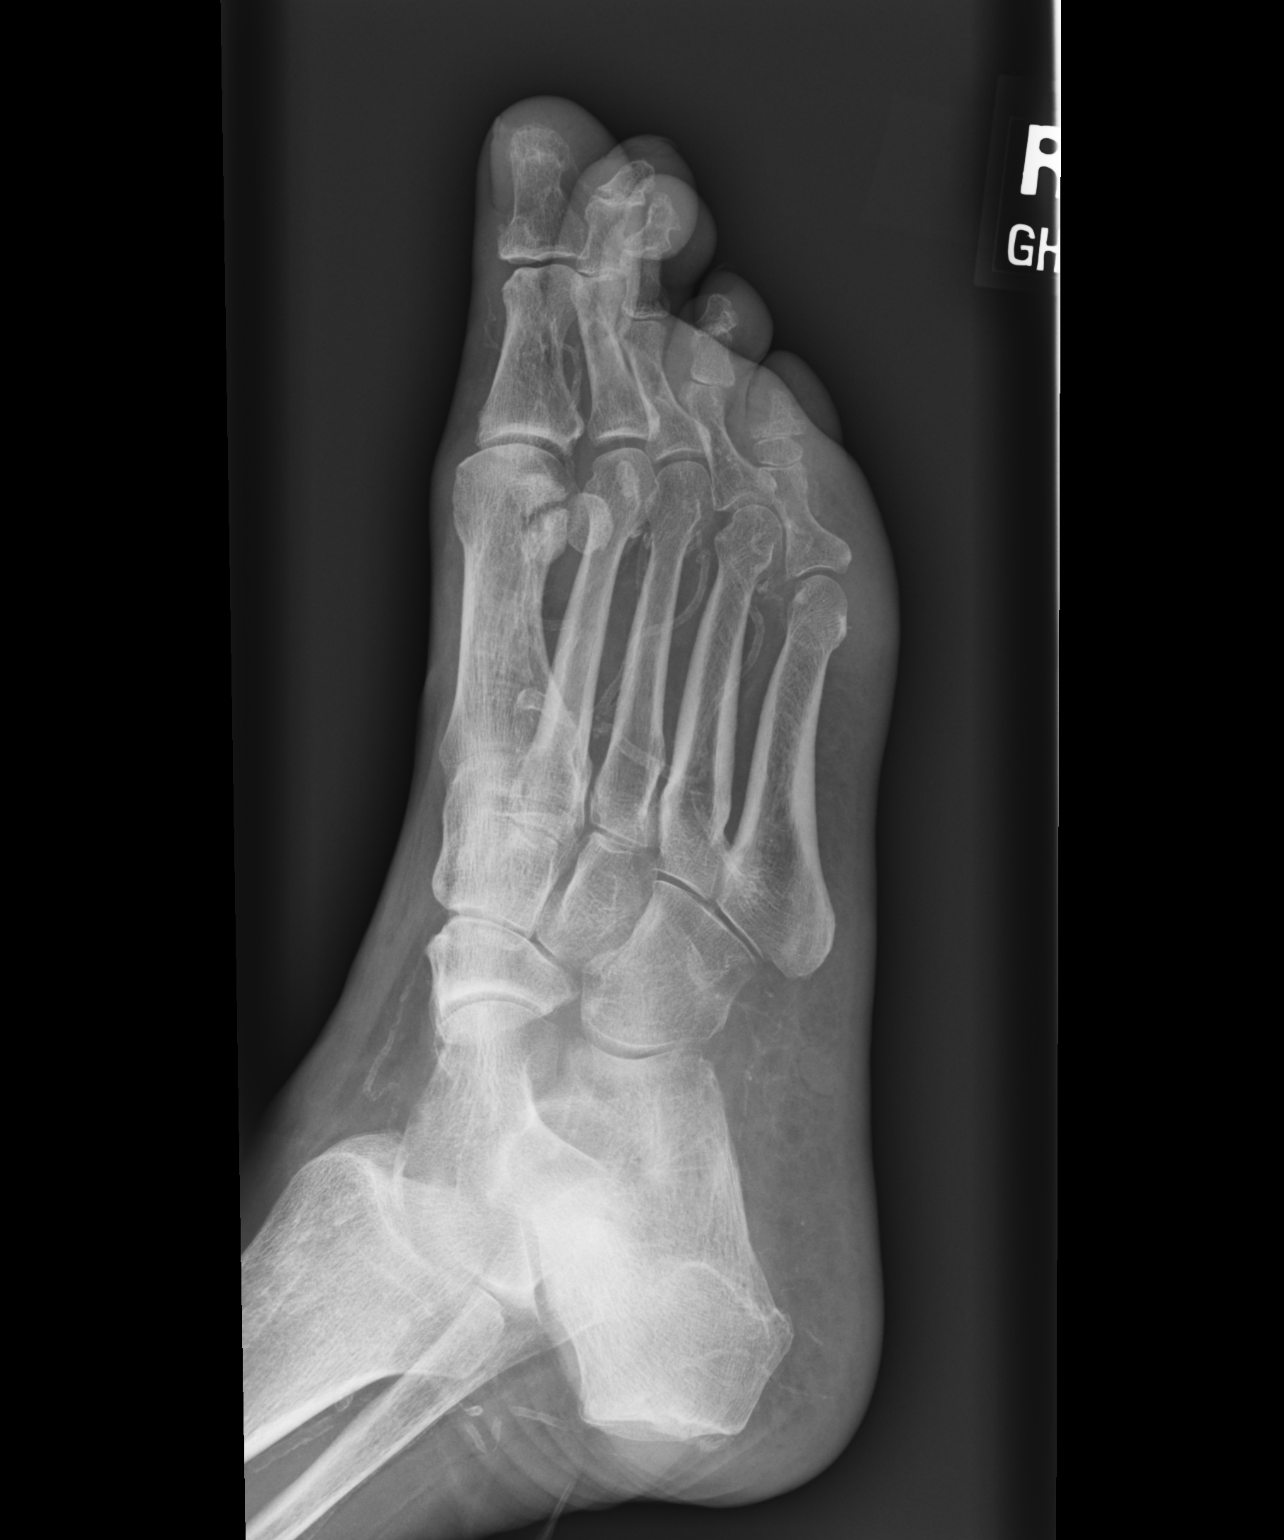

[dg foot complete right (3 of 3)]
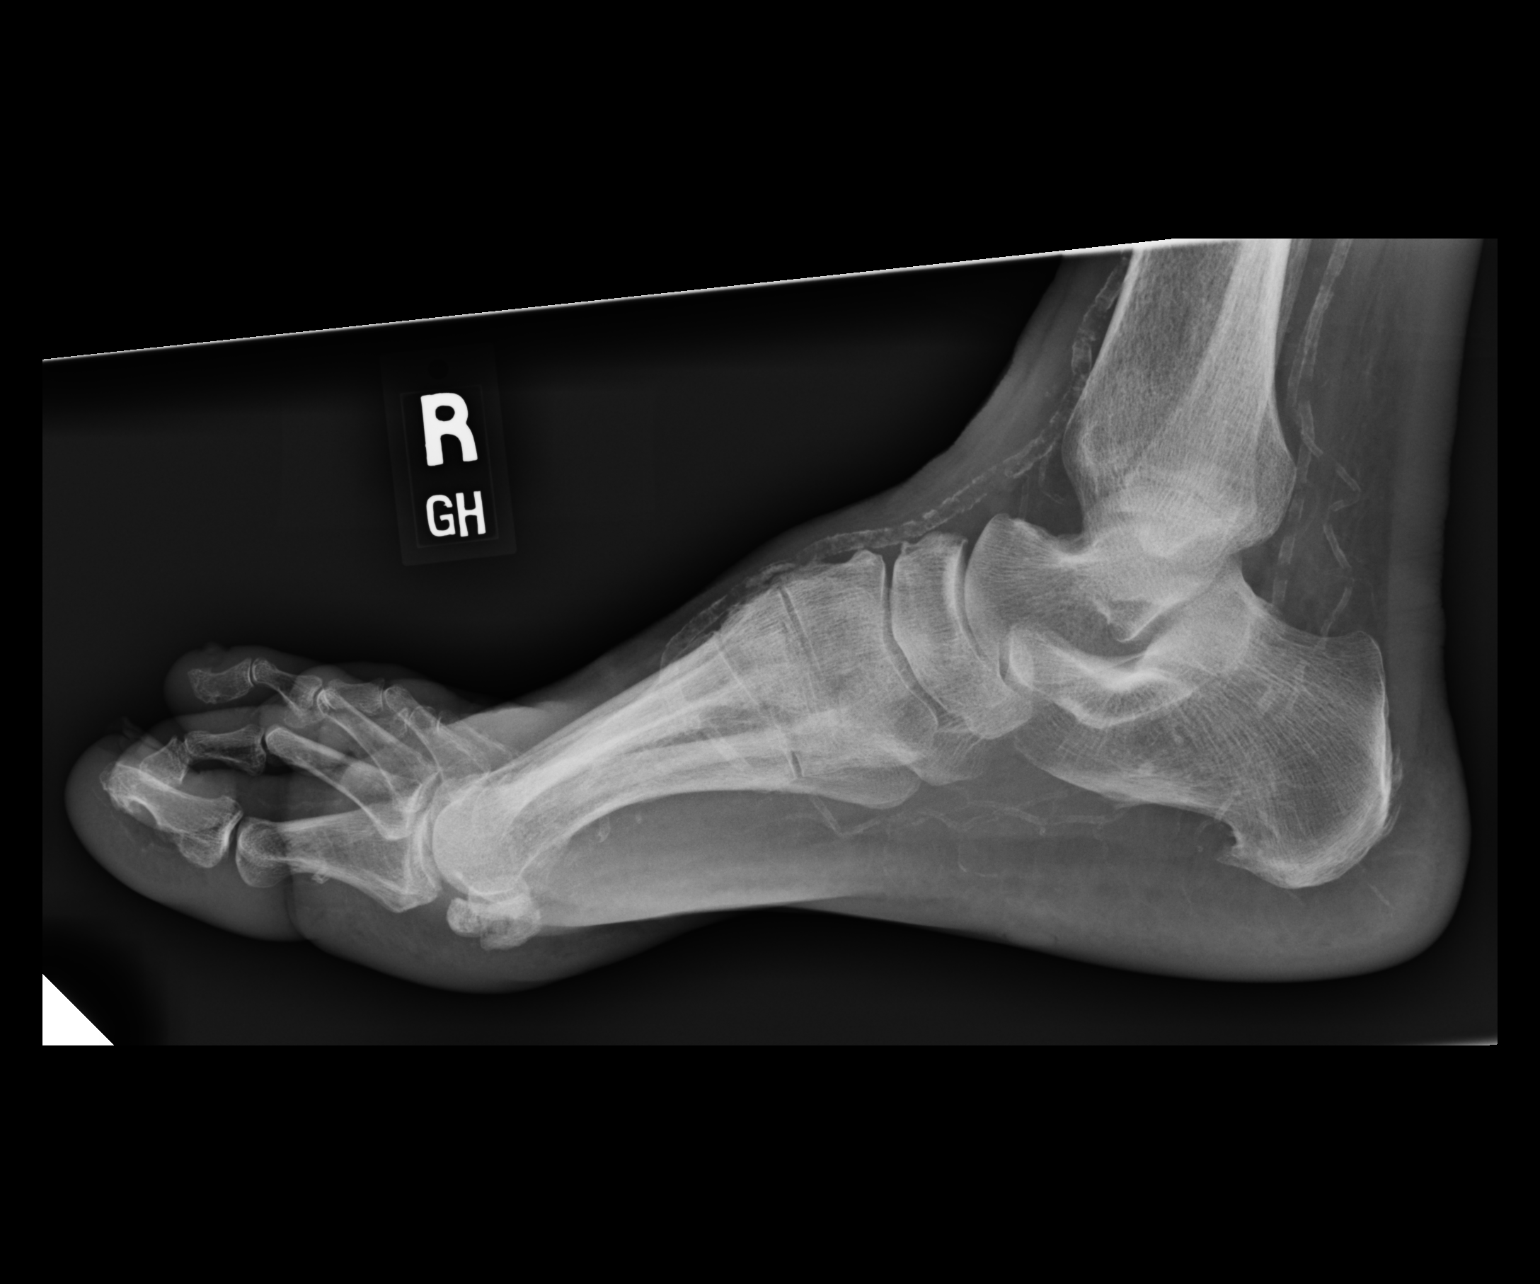

[3 of 3 positions shown; findings below may reference images not displayed]

FINDINGS: There is no evidence of fracture or dislocation. There is no
evidence of arthropathy or other focal bone abnormality. Vascular
calcifications are noted.
IMPRESSION: No acute abnormality seen.

## 2022-02-14 NOTE — Progress Notes (Shared)
[facility] Note  02/26/2022     CHIEF COMPLAINT Patient presents for No chief complaint on file.  HISTORY OF PRESENT ILLNESS: Andrew Chan is a 67 y.o. male who presents to the clinic today for:     Referring physician: [facility], [place],  [place] [address]  HISTORICAL INFORMATION:   Selected notes from the MEDICAL RECORD NUMBER Referred by [facility] for eval of diabetic retinopathy   CURRENT MEDICATIONS: Current Outpatient Medications (Ophthalmic Drugs)  Medication Sig   dorzolamide-timolol (COSOPT) 22.3-6.8 MG/ML ophthalmic solution Place 1 drop into the right eye 2 (two) times daily.   No current facility-administered medications for this visit. (Ophthalmic Drugs)   Current Outpatient Medications (Other)  Medication Sig   acetaminophen (TYLENOL) 500 MG tablet Take 500 mg by mouth every 6 (six) hours as needed for moderate pain or headache.   apixaban (ELIQUIS) 2.5 MG TABS tablet Take 1 tablet by mouth in the morning and at bedtime.   aspirin EC 81 MG tablet Take 81 mg by mouth daily. Swallow whole. NOT taken on dialysis days.   atorvastatin (LIPITOR) 80 MG tablet Take 80 mg by mouth daily.   AURYXIA 1 GM 210 MG(Fe) tablet Take 210 mg by mouth 3 (three) times daily.   Cholecalciferol 50 MCG (2000 UT) CAPS Take 2,000 mg by mouth daily.   gabapentin (NEURONTIN) 300 MG capsule Take 1 capsule by mouth in the morning, at noon, and at bedtime.   insulin glargine (LANTUS) 100 UNIT/ML injection Inject 4 Units into the skin at bedtime as needed (for blood sugar over 150).   isosorbide mononitrate (IMDUR) 30 MG 24 hr tablet Take 0.5 tablets (15 mg total) by mouth daily.   sucroferric oxyhydroxide (VELPHORO) 500 MG chewable tablet Chew 500 mg by mouth See admin instructions. Take 500 mg with each meal and 500 mg with each snack   No current facility-administered medications for this visit. (Other)    REVIEW OF SYSTEMS:   ALLERGIES Allergies  Allergen Reactions   Advil [Ibuprofen]     Stomach problems   PAST MEDICAL HISTORY Past Medical History:  Diagnosis Date   Anemia in chronic kidney disease    Colon cancer ([place])    Diabetes mellitus without complication ([place])    DVT (deep venous thrombosis) ([facility])    left upper extremity   End stage renal disease ([place])    Family history of colon cancer    Glaucoma    Hammertoes of both feet    Heart failure ([place])    Hypertension    Iron deficiency anemia    Mixed hyperlipidemia    Nerve damage    Nonischemic cardiomyopathy ([facility])    NSTEMI (non-ST elevated myocardial infarction) ([place])    PVD (peripheral vascular disease) ([place])    Retinal edema    Secondary hyperparathyroidism of renal origin ([place])    Senile nuclear sclerosis    Vitamin D deficiency    Past Surgical History:  Procedure Laterality Date   CARDIAC CATHETERIZATION Left 12/08/2014   Procedure: Left Heart Cath and Coronary Angiography;  Surgeon: Dionisio David, MD;  Location: [facility];  Service: Cardiovascular;  Laterality: Left;   CARDIAC CATHETERIZATION N/A 12/09/2014   Procedure: Coronary Stent Intervention;  Surgeon: Yolonda Kida, MD;  Location: [facility];  Service: Cardiovascular;  Laterality: N/A;   COLONOSCOPY     IR DIALY SHUNT INTRO  NEEDLE/INTRACATH INITIAL W/IMG RIGHT Right 05/20/2019   IR RADIOLOGIST EVAL & MGMT  03/30/2019   IR RADIOLOGIST EVAL & MGMT  05/03/2020   IR RADIOLOGIST EVAL & MGMT  12/17/2021   FAMILY HISTORY Family History  Problem Relation Age of Onset   CAD Mother    Heart failure Mother    Kidney failure Father    Diabetes Mellitus II Sister    Heart disease Sister    Colon cancer Sister    CAD Brother    Colon cancer Brother    Colon polyps Brother    Colon cancer Brother    Seizures Son    Other Daughter        died at birth   SOCIAL HISTORY Social History   Tobacco Use   Smoking status:  Never   Smokeless tobacco: Never  Vaping Use   Vaping Use: Never used  Substance Use Topics   Alcohol use: Not Currently   Drug use: Yes    Types: Cocaine       OPHTHALMIC EXAM:  Not recorded    IMAGING AND PROCEDURES  Imaging and Procedures for 02/26/2022          ASSESSMENT/PLAN:  No diagnosis found.  1. Proliferative diabetic retinopathy w/ DME, OU (OS > OD)  - A1c 7.4 on 11.1.22  - s/p PRP OS (unknown previous retina provider) -- room for fill in  - s/p PRP OS (01.31.23)  - s/p PRP OD (02.21.23) - *laser limited by dense cataract*)  - s/p IVA OD #1 (03.02.23) - exam shows scattered MA/DBH - FA (1.10.2023) shows late-leaking MA, vascular nonperfusion, +NVE OU (OS > OD) -- will benefit from PRP OU - OCT OD: Focal IRHM and cystic changes temporal macula, diffuse retinal thinning; OS: ERM w/central thickening and pucker, persistent cystic changes superior macula, significant tractional edema along ST arcades caught on widefield - stable - discussed findings and prognosis - recommend holding treatment at this time - pt agrees - monitor - f/u in 4 months DFE, OCT  2. Epiretinal membrane, OS - ERM superior to disc w/ traction - BCVA 20/80 -- decreased - monitor  3,4. Hypertensive retinopathy OU - discussed importance of tight BP control - monitor  5. Mixed Cataract OU - The symptoms of cataract, surgical options, and treatments and risks were discussed with patient. - discussed diagnosis and progression - cortical cataract is preventing adequate laser PRP uptake  - recommend cataract eval/possible surgery to improve monitoring and management of PDR OU - pt missed appt, will reschedule him with Dr. Katy Fitch for cataract evaluation   6. Ocular Hypertension OD  - IOP 21 today -- improved on Cosopt - cont Cosopt BID OD   Ophthalmic Meds Ordered this visit:  No orders of the defined types were placed in this encounter.    No follow-ups on file.  There are no  Patient Instructions on file for this visit.  Explained the diagnoses, plan, and follow up with the patient and they expressed understanding.  Patient expressed understanding of the importance of proper follow up care.   This document serves as a record of services personally performed by Gardiner Sleeper, MD, PhD. It was created on their behalf by Renaldo Reel, [place] an ophthalmic technician. The creation of this record is the provider's dictation and/or activities during the visit.    Electronically signed by:  Renaldo Reel, COT  1.11.24 9:01 AM   Gardiner Sleeper, M.D., Ph.D. Diseases & Surgery of the Retina  and Vitreous [facility]    Abbreviations: M myopia (nearsighted); A astigmatism; H hyperopia (farsighted); P presbyopia; Mrx spectacle prescription;  CTL contact lenses; OD right eye; OS left eye; OU both eyes  XT exotropia; ET esotropia; PEK punctate epithelial keratitis; PEE punctate epithelial erosions; DES dry eye syndrome; MGD meibomian gland dysfunction; ATs artificial tears; PFAT's preservative free artificial tears; [place] nuclear sclerotic cataract; PSC posterior subcapsular cataract; ERM epi-retinal membrane; PVD posterior vitreous detachment; RD retinal detachment; DM diabetes mellitus; DR diabetic retinopathy; NPDR non-proliferative diabetic retinopathy; PDR proliferative diabetic retinopathy; CSME clinically significant macular edema; DME diabetic macular edema; dbh dot blot hemorrhages; CWS cotton wool spot; POAG primary open angle glaucoma; C/D cup-to-disc ratio; HVF humphrey visual field; GVF goldmann visual field; OCT optical coherence tomography; IOP intraocular pressure; BRVO Branch retinal vein occlusion; CRVO central retinal vein occlusion; CRAO central retinal artery occlusion; BRAO branch retinal artery occlusion; RT retinal tear; SB scleral buckle; PPV pars plana vitrectomy; VH Vitreous hemorrhage; PRP panretinal laser photocoagulation; IVK  intravitreal kenalog; VMT vitreomacular traction; MH Macular hole;  NVD neovascularization of the disc; NVE neovascularization elsewhere; AREDS age related eye disease study; ARMD age related macular degeneration; POAG primary open angle glaucoma; EBMD epithelial/anterior basement membrane dystrophy; ACIOL anterior chamber intraocular lens; IOL intraocular lens; PCIOL posterior chamber intraocular lens; Phaco/IOL phacoemulsification with intraocular lens placement; [place] photorefractive keratectomy; LASIK laser assisted in situ keratomileusis; HTN hypertension; DM diabetes mellitus; COPD chronic obstructive pulmonary disease

## 2022-02-15 ENCOUNTER — Ambulatory Visit (INDEPENDENT_AMBULATORY_CARE_PROVIDER_SITE_OTHER): Payer: Medicaid Other

## 2022-02-15 DIAGNOSIS — I428 Other cardiomyopathies: Secondary | ICD-10-CM

## 2022-02-18 LAB — CUP PACEART REMOTE DEVICE CHECK
Battery Remaining Longevity: 55 mo
Battery Voltage: 2.96 V
Brady Statistic AP VP Percent: 0 %
Brady Statistic AP VS Percent: 0.04 %
Brady Statistic AS VP Percent: 0.03 %
Brady Statistic AS VS Percent: 99.93 %
Brady Statistic RA Percent Paced: 0.04 %
Brady Statistic RV Percent Paced: 0.03 %
Date Time Interrogation Session: 20240112223730
HighPow Impedance: 67 Ohm
Implantable Lead Connection Status: 753985
Implantable Lead Connection Status: 753985
Implantable Lead Implant Date: 20180918
Implantable Lead Implant Date: 20180918
Implantable Lead Location: 753859
Implantable Lead Location: 753860
Implantable Lead Model: 5076
Implantable Lead Model: 5076
Implantable Pulse Generator Implant Date: 20180918
Lead Channel Impedance Value: 456 Ohm
Lead Channel Impedance Value: 760 Ohm
Lead Channel Impedance Value: 779 Ohm
Lead Channel Pacing Threshold Amplitude: 0.375 V
Lead Channel Pacing Threshold Amplitude: 0.5 V
Lead Channel Pacing Threshold Pulse Width: 0.4 ms
Lead Channel Pacing Threshold Pulse Width: 0.4 ms
Lead Channel Sensing Intrinsic Amplitude: 10.625 mV
Lead Channel Sensing Intrinsic Amplitude: 10.625 mV
Lead Channel Sensing Intrinsic Amplitude: 4.75 mV
Lead Channel Sensing Intrinsic Amplitude: 4.75 mV
Lead Channel Setting Pacing Amplitude: 1.5 V
Lead Channel Setting Pacing Amplitude: 2 V
Lead Channel Setting Pacing Pulse Width: 0.4 ms
Lead Channel Setting Sensing Sensitivity: 0.3 mV
Zone Setting Status: 755011
Zone Setting Status: 755011

## 2022-02-19 ENCOUNTER — Encounter (INDEPENDENT_AMBULATORY_CARE_PROVIDER_SITE_OTHER): Payer: Medicaid Other | Admitting: Ophthalmology

## 2022-02-26 ENCOUNTER — Encounter (INDEPENDENT_AMBULATORY_CARE_PROVIDER_SITE_OTHER): Payer: Medicaid Other | Admitting: Ophthalmology

## 2022-02-26 DIAGNOSIS — H35033 Hypertensive retinopathy, bilateral: Secondary | ICD-10-CM

## 2022-02-26 DIAGNOSIS — H40051 Ocular hypertension, right eye: Secondary | ICD-10-CM

## 2022-02-26 DIAGNOSIS — I10 Essential (primary) hypertension: Secondary | ICD-10-CM

## 2022-02-26 DIAGNOSIS — H35372 Puckering of macula, left eye: Secondary | ICD-10-CM

## 2022-02-26 DIAGNOSIS — E113513 Type 2 diabetes mellitus with proliferative diabetic retinopathy with macular edema, bilateral: Secondary | ICD-10-CM

## 2022-02-26 DIAGNOSIS — H25813 Combined forms of age-related cataract, bilateral: Secondary | ICD-10-CM

## 2022-03-05 NOTE — Progress Notes (Signed)
Remote ICD transmission.   

## 2022-03-21 ENCOUNTER — Ambulatory Visit: Payer: Medicaid Other | Admitting: Internal Medicine

## 2022-04-15 ENCOUNTER — Ambulatory Visit: Payer: Medicaid Other | Admitting: Podiatry

## 2022-04-23 ENCOUNTER — Ambulatory Visit: Payer: Medicaid Other | Admitting: Podiatry

## 2022-04-29 ENCOUNTER — Ambulatory Visit (INDEPENDENT_AMBULATORY_CARE_PROVIDER_SITE_OTHER): Payer: Medicaid Other | Admitting: Podiatry

## 2022-04-29 DIAGNOSIS — Z91199 Patient's noncompliance with other medical treatment and regimen due to unspecified reason: Secondary | ICD-10-CM

## 2022-04-29 NOTE — Progress Notes (Signed)
No show

## 2022-05-21 ENCOUNTER — Ambulatory Visit (INDEPENDENT_AMBULATORY_CARE_PROVIDER_SITE_OTHER): Payer: Medicaid Other | Admitting: Podiatry

## 2022-05-21 ENCOUNTER — Encounter: Payer: Self-pay | Admitting: Podiatry

## 2022-05-21 DIAGNOSIS — M79675 Pain in left toe(s): Secondary | ICD-10-CM

## 2022-05-21 DIAGNOSIS — E1140 Type 2 diabetes mellitus with diabetic neuropathy, unspecified: Secondary | ICD-10-CM | POA: Diagnosis not present

## 2022-05-21 DIAGNOSIS — M79674 Pain in right toe(s): Secondary | ICD-10-CM

## 2022-05-21 DIAGNOSIS — B351 Tinea unguium: Secondary | ICD-10-CM

## 2022-05-21 NOTE — Progress Notes (Signed)
  Subjective:  Patient ID: Andrew Chan, male    DOB: 16-Jul-1955,   MRN: [id number]  Chief Complaint  Patient presents with   Nail Problem     Routine foot care    67 y.o. male presents for concern of thickened elongated and painful nails that are difficult to trim. Requesting to have them trimmed today. Relates burning and tingling in their feet. Patient is diabetic and last A1c was  Lab Results  Component Value Date   HGBA1C 11.6 (H) 12/07/2014   .   PCP:  [facility], [address] [place]    . Denies any other pedal complaints. Denies n/v/f/c.   Past Medical History:  Diagnosis Date   Anemia in chronic kidney disease    Colon cancer    Diabetes mellitus without complication    DVT (deep venous thrombosis)    left upper extremity   End stage renal disease    Family history of colon cancer    Glaucoma    Hammertoes of both feet    Heart failure    Hypertension    Iron deficiency anemia    Mixed hyperlipidemia    Nerve damage    Nonischemic cardiomyopathy    NSTEMI (non-ST elevated myocardial infarction)    PVD (peripheral vascular disease)    Retinal edema    Secondary hyperparathyroidism of renal origin    Senile nuclear sclerosis    Vitamin D deficiency     Objective:  Physical Exam: Vascular: DP/PT pulses 2/4 bilateral. CFT <3 seconds. Absent hair growth on digits. Edema noted to bilateral lower extremities. Xerosis noted bilaterally.  Skin. No lacerations or abrasions bilateral feet. Nails 1-5 bilateral  are thickened discolored and elongated with subungual debris.  Musculoskeletal: MMT 5/5 bilateral lower extremities in DF, PF, Inversion and Eversion. Deceased ROM in DF of ankle joint.  Neurological: Sensation intact to light touch. Protective sensation diminished bilateral.    Assessment:   1. Type 2 diabetes mellitus with diabetic neuropathy, unspecified whether long term insulin use   2. Pain due to onychomycosis of toenails of both feet       Plan:  Patient was evaluated and treated and all questions answered. -Discussed and educated patient on diabetic foot care, especially with  regards to the vascular, neurological and musculoskeletal systems.  -Stressed the importance of good glycemic control and the detriment of not  controlling glucose levels in relation to the foot. -Discussed supportive shoes at all times and checking feet regularly.  -Mechanically debrided all nails 1-5 bilateral using sterile nail nipper and filed with dremel without incident  -DM shoes ordered.  -Answered all patient questions -Patient to return  in 3 months for at risk foot care -Patient advised to call the office if any problems or questions arise in the meantime.   Louann Sjogren, DPM

## 2022-05-31 ENCOUNTER — Ambulatory Visit: Payer: Medicaid Other | Attending: Internal Medicine | Admitting: Internal Medicine

## 2022-05-31 VITALS — BP 128/74 | HR 84 | Ht 68.0 in | Wt 169.8 lb

## 2022-05-31 DIAGNOSIS — Z992 Dependence on renal dialysis: Secondary | ICD-10-CM

## 2022-05-31 DIAGNOSIS — R9431 Abnormal electrocardiogram [ECG] [EKG]: Secondary | ICD-10-CM

## 2022-05-31 DIAGNOSIS — I428 Other cardiomyopathies: Secondary | ICD-10-CM

## 2022-05-31 DIAGNOSIS — E785 Hyperlipidemia, unspecified: Secondary | ICD-10-CM

## 2022-05-31 DIAGNOSIS — E1122 Type 2 diabetes mellitus with diabetic chronic kidney disease: Secondary | ICD-10-CM

## 2022-05-31 DIAGNOSIS — Z794 Long term (current) use of insulin: Secondary | ICD-10-CM

## 2022-05-31 DIAGNOSIS — I5022 Chronic systolic (congestive) heart failure: Secondary | ICD-10-CM | POA: Diagnosis not present

## 2022-05-31 DIAGNOSIS — Z86718 Personal history of other venous thrombosis and embolism: Secondary | ICD-10-CM

## 2022-05-31 DIAGNOSIS — Z95810 Presence of automatic (implantable) cardiac defibrillator: Secondary | ICD-10-CM

## 2022-05-31 DIAGNOSIS — I10 Essential (primary) hypertension: Secondary | ICD-10-CM

## 2022-05-31 DIAGNOSIS — N186 End stage renal disease: Secondary | ICD-10-CM

## 2022-05-31 DIAGNOSIS — I2510 Atherosclerotic heart disease of native coronary artery without angina pectoris: Secondary | ICD-10-CM | POA: Diagnosis not present

## 2022-05-31 NOTE — Progress Notes (Addendum)
Cardiology Office Note:    Date:  05/31/2022   ID:  Andrew Chan, DOB 1955-04-08, MRN [id number]  PCP:  [facility]  Cardiologist:  Parke Poisson, MD  Electrophysiologist:  Lanier Prude, MD   Referring MD: [facility]   Chief Complaint/Reason for Referral: HFrEF  History of Present Illness:    Andrew Chan is a 67 y.o. 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  He was followed by Dr. Wonda Cheng at [facility] for heart failure.  He transitioned care to [facility] and is seeking renal transplant evaluation at [facility].  Extensive review of prior documentation including [facility].  History provided by the patient as well as collaborative history from his sister who was present for his visit 03/2020.  He had an echocardiogram performed in 2018 documenting 20% ejection fraction and class II-III symptoms.  On his last evaluation in April 2020 at [facility] [place] was felt to be 40%.  6-minute walk test documented significant impairment, only walking 800 feet.  ICD implanted 2018 for primary prevention, given persistently low EF despite GDMT and PCI to the RCA.  Optivol readings previously document adequate volume status. He describes prior device therapy.  Has history of DVT, provoked initially during ICD implant, wire associated LUE DVT on 01/02/2017. Has been seen by Heme/Onc, who recommend indefinite anticoagulation.  For a period of time he was no longer on anticoagulation, however I note today he is on Eliquis 2.5 mg twice daily.  History of CAD with PCI in 2016 to the RCA, previously on plavix.   At his appointment 03/2020 he was doing well overall. NYHA class I-II symptoms. Denied CP or SOB. IHD on MWF.  Had appointment October 5 with [facility] for renal transplant evaluation. Much of his HF therapy had been  stopped due to hypotension with dialysis.  He was seen by EP 05/2021 and complained of atypical chest pain. Steffanie Dunn 06/2021 was consistent with prior MI, no reversible ischemia, EF estimated 34%, considered high risk due to moderate to severe reduction in LV systolic function. Echo 08/2021 revealed LVEF 40-45%, LV global hypokinesis, G1 DD, elevated LVEDP, normal RV systolic function, mild mitral valve regurgitation, mild aortic valve regurgitation.  He was started on Imdur 15 mg daily.   He followed up with Bernadene Person, NP 09/2021 and was tolerating Imdur and Eliquis. Continued on dialysis MWF, with intermittent hypotension on dialysis days. He was still trying to get on the list for renal transplant. He was euvolemic on exam. Blood pressure well controlled. LDL 65 as of 03/2021.   Today, he states that his dialysis earlier today went well. He notes that he is typically cold, especially on dialysis days. On dialysis days he skips Imdur. He is compliant with 2.5 mg Eliquis BID and 81 mg ASA.  Would have nephrology review his Eliquis and aspirin, as he may not require both.  For CAD history Eliquis 2.5 mg twice daily should suffice, unclear if there is an alternate indication for aspirin.  Since his visit in 09/2021 he denies any recurring chest pain or pressure.  He denies any palpitations, shortness of breath, or peripheral edema. No lightheadedness, headaches, syncope, orthopnea, or PND.   Past Medical History:  Diagnosis Date   Anemia in chronic kidney disease    Colon cancer (HCC)  Diabetes mellitus without complication (HCC)    DVT (deep venous thrombosis) (HCC)    left upper extremity   End stage renal disease (HCC)    Family history of colon cancer    Glaucoma    Hammertoes of both feet    Heart failure (HCC)    Hypertension    Iron deficiency anemia    Mixed hyperlipidemia    Nerve damage    Nonischemic cardiomyopathy (HCC)    NSTEMI (non-ST elevated myocardial infarction)  (HCC)    PVD (peripheral vascular disease) (HCC)    Retinal edema    Secondary hyperparathyroidism of renal origin (HCC)    Senile nuclear sclerosis    Vitamin D deficiency     Past Surgical History:  Procedure Laterality Date   CARDIAC CATHETERIZATION Left 12/08/2014   Procedure: Left Heart Cath and Coronary Angiography;  Surgeon: Laurier Nancy, MD;  Location: [facility] INVASIVE CV LAB;  Service: Cardiovascular;  Laterality: Left;   CARDIAC CATHETERIZATION N/A 12/09/2014   Procedure: Coronary Stent Intervention;  Surgeon: Alwyn Pea, MD;  Location: [facility] INVASIVE CV LAB;  Service: Cardiovascular;  Laterality: N/A;   COLONOSCOPY     IR DIALY SHUNT INTRO NEEDLE/INTRACATH INITIAL W/IMG RIGHT Right 05/20/2019   IR RADIOLOGIST EVAL & MGMT  03/30/2019   IR RADIOLOGIST EVAL & MGMT  05/03/2020   IR RADIOLOGIST EVAL & MGMT  12/17/2021    Current Medications: Current Meds  Medication Sig   acetaminophen (TYLENOL) 500 MG tablet Take 500 mg by mouth every 6 (six) hours as needed for moderate pain or headache.   apixaban (ELIQUIS) 2.5 MG TABS tablet Take 1 tablet by mouth in the morning and at bedtime.   aspirin EC 81 MG tablet Take 81 mg by mouth daily. Swallow whole. NOT taken on dialysis days.   atorvastatin (LIPITOR) 80 MG tablet Take 80 mg by mouth daily.   AURYXIA 1 GM 210 MG(Fe) tablet Take 210 mg by mouth 3 (three) times daily.   Cholecalciferol 50 MCG (2000 UT) CAPS Take 2,000 mg by mouth daily.   gabapentin (NEURONTIN) 300 MG capsule Take 1 capsule by mouth in the morning, at noon, and at bedtime.   insulin glargine (LANTUS) 100 UNIT/ML injection Inject 4 Units into the skin at bedtime as needed (for blood sugar over 150).   isosorbide mononitrate (IMDUR) 30 MG 24 hr tablet Take 0.5 tablets (15 mg total) by mouth daily.   sucroferric oxyhydroxide (VELPHORO) 500 MG chewable tablet Chew 500 mg by mouth See admin instructions. Take 500 mg with each meal and 500 mg with each snack      Allergies:   Advil [ibuprofen]   Social History   Tobacco Use   Smoking status: Never   Smokeless tobacco: Never  Vaping Use   Vaping Use: Never used  Substance Use Topics   Alcohol use: Not Currently   Drug use: Yes    Types: Cocaine     Family History: The patient's family history includes CAD in his brother and mother; Colon cancer in his brother, brother, and sister; Colon polyps in his brother; Diabetes Mellitus II in his sister; Heart disease in his sister; Heart failure in his mother; Kidney failure in his father; Other in his daughter; Seizures in his son.  ROS:   Please see the history of present illness.    All other systems reviewed and are negative.  EKGs/Labs/Other Studies Reviewed:    The following studies were reviewed today:  Echocardiogram  08/14/2021:  1. Left ventricular ejection fraction, by estimation, is 40 to 45%. The  left ventricle has mildly decreased function. The left ventricle  demonstrates global hypokinesis. Left ventricular diastolic parameters are  consistent with Grade I diastolic  dysfunction (impaired relaxation). Elevated left ventricular end-diastolic  pressure. The average left ventricular global longitudinal strain is -16.0  %. The global longitudinal strain is abnormal.   2. Right ventricular systolic function is normal. The right ventricular  size is normal. There is normal pulmonary artery systolic pressure.   3. Left atrial size was mild to moderately dilated.   4. The mitral valve is normal in structure. Mild mitral valve  regurgitation. No evidence of mitral stenosis.   5. The aortic valve is tricuspid. There is mild calcification of the  aortic valve. There is mild thickening of the aortic valve. Aortic valve  regurgitation is mild. No aortic stenosis is present.   6. The inferior vena cava is normal in size with greater than 50%  respiratory variability, suggesting right atrial pressure of 3 mmHg.   Lexiscan Myoview  stress test  06/05/2021:   Findings are consistent with prior myocardial infarction. The study is high risk, due to degree of left ventricular dysfunction   No ST deviation was noted.   LV perfusion is abnormal. Defect 1: There is a large defect with moderate reduction in uptake present in the mid to basal inferior and inferolateral location(s) that is fixed. There is abnormal wall motion in the defect area. Consistent with infarction.   Left ventricular function is abnormal. Global function is moderately reduced. Nuclear stress EF: 34 %. The left ventricular ejection fraction is moderately decreased (30-44%). End diastolic cavity size is moderately enlarged. There is global left ventricular hypokinesis, disproportionately severe in the inferolateral wall   Prior study not available for comparison.   High risk study due to moderate-to-severe reduction in left ventricular systolic function. There is inferolateral infarction, but there is no meaningful reversible ischemia. Consider multivessel CAD.  ABI Doppler  12/23/2019: Summary:  Right: Resting right ankle-brachial index indicates noncompressible right  lower extremity arteries. The right toe-brachial index is normal.   Multiphasic pedal waveforms indicates adequate arterial flow.   Left: Resting left ankle-brachial index indicates noncompressible left  lower extremity arteries. The left toe-brachial index is abnormal.   Multiphasic pedal waveforms indicates adequate arterial flow.   Coronary Stent Intervention  12/09/2014: Mid RCA lesion, 90% stenosed. Post intervention, there is a 0% residual stenosis. The lesion was not previously treated. Mid RCA to Dist RCA lesion, 40% stenosed. Post intervention, there is a 0% residual stenosis. successful PCI and stent mid right coronary artery 3.5 x 18 DES small distal stent dissection requiring placement of a small 3.5 x 12 DES to cover the dissection maintain Plavix aspirin anticipate discharge  within 24 hours  Left Heart Cath  12/08/2014: Mid RCA lesion, 80% stenosed. The lesion was not previously treated. 2nd Diag lesion, 70% stenosed. Mid LAD to Dist LAD lesion, 50% stenosed. 1st Mrg lesion, 55% stenosed.   High grade lesion in mid RCA with small diagnal having 70% and moderate mid LAD disease with normal LVEF. Will do PCI on culprit vessel  Mid  RCA. Patient has small hematoma on right groin so may deffer PCI tomorrow.  EKG:  EKG is personally reviewed. 05/31/2022:  SR, ST-T wave abnormality, inferolateral infarct. 11/02/2019:  SR, LAE, inferior infarct  Recent Labs: No results found for requested labs within last 365 days.   Recent Lipid Panel  Component Value Date/Time   CHOL 198 12/10/2014 0425   TRIG 88 12/10/2014 0425   HDL 60 12/10/2014 0425   CHOLHDL 3.3 12/10/2014 0425   VLDL 18 12/10/2014 0425   LDLCALC 120 (H) 12/10/2014 0425    Physical Exam:    VS:  BP 128/74   Pulse 84   Ht 5\' 8"  (1.727 m)   Wt 169 lb 12.8 oz (77 kg)   SpO2 97%   BMI 25.82 kg/m     Wt Readings from Last 5 Encounters:  05/31/22 169 lb 12.8 oz (77 kg)  09/13/21 172 lb 3.2 oz (78.1 kg)  06/05/21 178 lb (80.7 kg)  05/17/21 178 lb (80.7 kg)  09/19/20 179 lb (81.2 kg)    Constitutional: No acute distress Eyes: sclera non-icteric, normal conjunctiva and lids ENMT: normal dentition, moist mucous membranes Chest: Left chest wall ICD generator.  Cardiovascular: regular rhythm, normal rate, soft systolic murmur. S1 and S2 normal. No jugular venous distention.  Respiratory: clear to auscultation bilaterally GI : normal bowel sounds, soft and nontender. No distention.   MSK: extremities warm, well perfused. No edema. Right forearm AV fistula with appropriate thrill.  NEURO: grossly nonfocal exam, moves all extremities. PSYCH: alert and oriented x 3, normal mood and affect.   ASSESSMENT:    1. Chronic systolic heart failure (HCC)   2. Abnormal EKG   3. Nonischemic cardiomyopathy  (HCC)   4. Coronary artery disease involving native coronary artery of native heart without angina pectoris   5. Presence of cardiac defibrillator   6. Essential hypertension   7. Hyperlipidemia LDL goal <70   8. Type 2 diabetes mellitus with chronic kidney disease on chronic dialysis, with long-term current use of insulin (HCC)   9. History of DVT (deep vein thrombosis)   10. ESRD (end stage renal disease) (HCC)     PLAN:    Chronic systolic heart failure (HCC) - Plan: EKG 12-Lead, Presence of cardiac defibrillator  Abnormal EKG -Heart failure therapy has been held in the setting of dialysis with hypotension, does not appear to to have been resumed.  Blood pressure is reasonable today despite undergoing dialysis, we may want to consider reinitiation of heart failure therapy as tolerated, will repeat echocardiogram given abnormal EKG today and evaluate LV function.  Last EF 40 to 45%, suspect there is still an indication for heart failure med titration.  If EF remains less than 50%, would consider referral to CVRR for heart failure medication titration as tolerated.  End stage renal disease [facility]) -dialysis Monday Wednesday Friday, tolerating well.  History of DVT (deep vein thrombosis) -I have previously reviewed this in depth as documented in prior notes.  Patient appears to now be on apixaban 2.5 mg twice daily as dictated by primary care or nephrology.  Coronary artery disease involving native coronary artery of native heart without angina pectoris -Previously on DAPT, then aspirin, now aspirin and Eliquis.  Suspect he only needs Eliquis from a cardiac standpoint, if no other indication for aspirin, would consider stopping his aspirin and continuing Eliquis and monotherapy for history of CAD with PCI.  Follow-up:  6 months.  Total time of encounter: 20 minutes total time of encounter, including 15 minutes spent in face-to-face patient care on the date of this encounter. This time  includes coordination of care and counseling regarding above mentioned problem list. Remainder of non-face-to-face time involved reviewing chart documents/testing relevant to the patient encounter and documentation in the medical record. I have independently  reviewed documentation from referring provider. Extensive independent review of Care Everywhere and [facility] clinic documentation.  Weston Brass, MD [place]  [facility]   Medication Adjustments/Labs and Tests Ordered: Current medicines are reviewed at length with the patient today.  Concerns regarding medicines are outlined above.   Orders Placed This Encounter  Procedures   EKG 12-Lead   ECHOCARDIOGRAM COMPLETE   No orders of the defined types were placed in this encounter.  Patient Instructions  Medication Instructions:  No Changes In Medications at this time.  *If you need a refill on your cardiac medications before your next appointment, please call your pharmacy*  Testing/Procedures: Your physician has requested that you have an echocardiogram. Echocardiography is a painless test that uses sound waves to create images of your heart. It provides your doctor with information about the size and shape of your heart and how well your heart's chambers and valves are working. You may receive an ultrasound enhancing agent through an IV if needed to better visualize your heart during the echo.This procedure takes approximately one hour. There are no restrictions for this procedure. This will take place at the [address].   Follow-Up: At [facility], you and your health needs are our priority.  As part of our continuing mission to provide you with exceptional heart care, we have created designated Provider Care Teams.  These Care Teams include your primary Cardiologist (physician) and Advanced Practice Providers (APPs -  Physician Assistants and Nurse Practitioners) who all work together to provide  you with the care you need, when you need it.  Your next appointment:   6 month(s)  Provider:   Parke Poisson, MD   OR APP   I,Mathew Stumpf,acting as a scribe for Parke Poisson, MD.,have documented all relevant documentation on the behalf of Parke Poisson, MD,as directed by  Parke Poisson, MD while in the presence of Parke Poisson, MD.  I, Parke Poisson, MD, have reviewed all documentation for the visit on 05/31/2022. The documentation on today's date of service for the exam, diagnosis, procedures, and orders are all accurate and complete.

## 2022-05-31 NOTE — Patient Instructions (Signed)
Medication Instructions:  No Changes In Medications at this time.  *If you need a refill on your cardiac medications before your next appointment, please call your pharmacy*  Testing/Procedures: Your physician has requested that you have an echocardiogram. Echocardiography is a painless test that uses sound waves to create images of your heart. It provides your doctor with information about the size and shape of your heart and how well your heart's chambers and valves are working. You may receive an ultrasound enhancing agent through an IV if needed to better visualize your heart during the echo.This procedure takes approximately one hour. There are no restrictions for this procedure. This will take place at the [address].   Follow-Up: At [facility], you and your health needs are our priority.  As part of our continuing mission to provide you with exceptional heart care, we have created designated Provider Care Teams.  These Care Teams include your primary Cardiologist (physician) and Advanced Practice Providers (APPs -  Physician Assistants and Nurse Practitioners) who all work together to provide you with the care you need, when you need it.  Your next appointment:   6 month(s)  Provider:   Parke Poisson, MD   OR APP

## 2022-06-10 ENCOUNTER — Other Ambulatory Visit: Payer: Medicaid Other

## 2022-07-03 ENCOUNTER — Ambulatory Visit (HOSPITAL_COMMUNITY): Payer: Medicaid Other | Attending: Cardiology

## 2022-07-03 DIAGNOSIS — R9431 Abnormal electrocardiogram [ECG] [EKG]: Secondary | ICD-10-CM | POA: Diagnosis present

## 2022-07-03 DIAGNOSIS — I5022 Chronic systolic (congestive) heart failure: Secondary | ICD-10-CM | POA: Diagnosis present

## 2022-07-03 LAB — ECHOCARDIOGRAM COMPLETE
Area-P 1/2: 4.29 cm2
Est EF: 35
MV M vel: 4.82 m/s
MV Peak grad: 92.9 mmHg
S' Lateral: 4.5 cm

## 2022-07-04 ENCOUNTER — Ambulatory Visit (HOSPITAL_COMMUNITY): Payer: Medicaid Other

## 2022-07-05 ENCOUNTER — Other Ambulatory Visit: Payer: Self-pay

## 2022-07-05 DIAGNOSIS — I5022 Chronic systolic (congestive) heart failure: Secondary | ICD-10-CM

## 2022-07-05 DIAGNOSIS — I428 Other cardiomyopathies: Secondary | ICD-10-CM

## 2022-10-02 ENCOUNTER — Ambulatory Visit (INDEPENDENT_AMBULATORY_CARE_PROVIDER_SITE_OTHER): Payer: Medicare Other | Admitting: Podiatry

## 2022-10-02 DIAGNOSIS — Z91199 Patient's noncompliance with other medical treatment and regimen due to unspecified reason: Secondary | ICD-10-CM

## 2022-10-05 NOTE — Progress Notes (Signed)
1. No-show for appointment     

## 2022-10-17 ENCOUNTER — Telehealth (HOSPITAL_COMMUNITY): Payer: Self-pay | Admitting: Vascular Surgery

## 2022-10-17 NOTE — Telephone Encounter (Signed)
PT moved out of [facility]

## 2022-11-11 ENCOUNTER — Telehealth (HOSPITAL_COMMUNITY): Payer: Self-pay | Admitting: Internal Medicine

## 2022-11-11 NOTE — Telephone Encounter (Signed)
I called to schedule echocardiogram due in November and patients sister states that he has moved to [place]. Order will be removed from the echo WQ. Thank you.

## 2023-07-06 DEATH — deceased
